# Patient Record
Sex: Female | Born: 1970 | Race: White | Hispanic: No | Smoking: Former smoker
Health system: Southern US, Community
[De-identification: ages and names within clinical notes are randomized; demographics above are authoritative.]

## PROBLEM LIST (undated history)

## (undated) DIAGNOSIS — R51 Headache: Secondary | ICD-10-CM

## (undated) DIAGNOSIS — E079 Disorder of thyroid, unspecified: Secondary | ICD-10-CM

## (undated) DIAGNOSIS — J309 Allergic rhinitis, unspecified: Secondary | ICD-10-CM

## (undated) DIAGNOSIS — F32A Depression, unspecified: Secondary | ICD-10-CM

## (undated) DIAGNOSIS — R7309 Other abnormal glucose: Secondary | ICD-10-CM

## (undated) DIAGNOSIS — IMO0001 Reserved for inherently not codable concepts without codable children: Secondary | ICD-10-CM

## (undated) DIAGNOSIS — M1990 Unspecified osteoarthritis, unspecified site: Secondary | ICD-10-CM

## (undated) DIAGNOSIS — K219 Gastro-esophageal reflux disease without esophagitis: Secondary | ICD-10-CM

## (undated) DIAGNOSIS — I10 Essential (primary) hypertension: Secondary | ICD-10-CM

## (undated) DIAGNOSIS — G4730 Sleep apnea, unspecified: Secondary | ICD-10-CM

## (undated) DIAGNOSIS — F329 Major depressive disorder, single episode, unspecified: Secondary | ICD-10-CM

## (undated) DIAGNOSIS — G894 Chronic pain syndrome: Secondary | ICD-10-CM

## (undated) DIAGNOSIS — T7840XA Allergy, unspecified, initial encounter: Secondary | ICD-10-CM

## (undated) DIAGNOSIS — F419 Anxiety disorder, unspecified: Secondary | ICD-10-CM

## (undated) HISTORY — DX: Headache: R51

## (undated) HISTORY — DX: Chronic pain syndrome: G89.4

## (undated) HISTORY — DX: Gastro-esophageal reflux disease without esophagitis: K21.9

## (undated) HISTORY — DX: Anxiety disorder, unspecified: F41.9

## (undated) HISTORY — DX: Allergy, unspecified, initial encounter: T78.40XA

## (undated) HISTORY — PX: UPPER GASTROINTESTINAL ENDOSCOPY: SHX188

## (undated) HISTORY — DX: Major depressive disorder, single episode, unspecified: F32.9

## (undated) HISTORY — DX: Depression, unspecified: F32.A

## (undated) HISTORY — DX: Allergic rhinitis, unspecified: J30.9

## (undated) HISTORY — DX: Essential (primary) hypertension: I10

## (undated) HISTORY — DX: Hemochromatosis, unspecified: E83.119

## (undated) HISTORY — DX: Reserved for inherently not codable concepts without codable children: IMO0001

## (undated) HISTORY — DX: Unspecified osteoarthritis, unspecified site: M19.90

## (undated) HISTORY — DX: Sleep apnea, unspecified: G47.30

## (undated) HISTORY — DX: Disorder of thyroid, unspecified: E07.9

## (undated) HISTORY — PX: TONSILECTOMY, ADENOIDECTOMY, BILATERAL MYRINGOTOMY AND TUBES: SHX2538

## (undated) HISTORY — DX: Other abnormal glucose: R73.09

---

## 1998-06-23 HISTORY — PX: ABDOMINAL HYSTERECTOMY: SHX81

## 2003-06-24 HISTORY — PX: CERVICAL FUSION: SHX112

## 2004-06-23 LAB — CONVERTED CEMR LAB: Pap Smear: NORMAL

## 2008-11-23 ENCOUNTER — Encounter: Payer: Self-pay | Admitting: Family Medicine

## 2009-10-23 ENCOUNTER — Ambulatory Visit: Payer: Self-pay | Admitting: Family Medicine

## 2009-10-23 DIAGNOSIS — R7309 Other abnormal glucose: Secondary | ICD-10-CM | POA: Insufficient documentation

## 2009-10-23 DIAGNOSIS — K219 Gastro-esophageal reflux disease without esophagitis: Secondary | ICD-10-CM

## 2009-10-23 DIAGNOSIS — R519 Headache, unspecified: Secondary | ICD-10-CM | POA: Insufficient documentation

## 2009-10-23 DIAGNOSIS — IMO0001 Reserved for inherently not codable concepts without codable children: Secondary | ICD-10-CM

## 2009-10-23 DIAGNOSIS — R32 Unspecified urinary incontinence: Secondary | ICD-10-CM | POA: Insufficient documentation

## 2009-10-23 DIAGNOSIS — M797 Fibromyalgia: Secondary | ICD-10-CM | POA: Insufficient documentation

## 2009-10-23 DIAGNOSIS — J309 Allergic rhinitis, unspecified: Secondary | ICD-10-CM

## 2009-10-23 DIAGNOSIS — R51 Headache: Secondary | ICD-10-CM | POA: Insufficient documentation

## 2009-10-23 DIAGNOSIS — I10 Essential (primary) hypertension: Secondary | ICD-10-CM | POA: Insufficient documentation

## 2009-10-23 HISTORY — DX: Allergic rhinitis, unspecified: J30.9

## 2009-10-23 HISTORY — DX: Reserved for inherently not codable concepts without codable children: IMO0001

## 2009-10-23 HISTORY — DX: Other abnormal glucose: R73.09

## 2009-10-23 HISTORY — DX: Headache: R51

## 2009-10-23 HISTORY — DX: Essential (primary) hypertension: I10

## 2009-10-23 HISTORY — DX: Gastro-esophageal reflux disease without esophagitis: K21.9

## 2009-10-24 DIAGNOSIS — F3289 Other specified depressive episodes: Secondary | ICD-10-CM

## 2009-10-24 DIAGNOSIS — F32A Depression, unspecified: Secondary | ICD-10-CM | POA: Insufficient documentation

## 2009-10-24 DIAGNOSIS — F329 Major depressive disorder, single episode, unspecified: Secondary | ICD-10-CM | POA: Insufficient documentation

## 2009-10-24 HISTORY — DX: Other specified depressive episodes: F32.89

## 2009-10-24 HISTORY — DX: Major depressive disorder, single episode, unspecified: F32.9

## 2009-10-25 ENCOUNTER — Ambulatory Visit: Payer: Self-pay | Admitting: Family Medicine

## 2009-10-26 LAB — CONVERTED CEMR LAB
AST: 23 units/L (ref 0–37)
Albumin: 4.5 g/dL (ref 3.5–5.2)
BUN: 12 mg/dL (ref 6–23)
Basophils Absolute: 0 10*3/uL (ref 0.0–0.1)
Basophils Relative: 0.5 % (ref 0.0–3.0)
Calcium: 9.7 mg/dL (ref 8.4–10.5)
Cholesterol: 180 mg/dL (ref 0–200)
Creatinine, Ser: 0.8 mg/dL (ref 0.4–1.2)
Eosinophils Absolute: 0.1 10*3/uL (ref 0.0–0.7)
GFR calc non Af Amer: 86.85 mL/min (ref >60)
HCT: 43.9 % (ref 36.0–46.0)
Hemoglobin, Urine: NEGATIVE
Hemoglobin: 15.3 g/dL — ABNORMAL HIGH (ref 12.0–15.0)
Ketones, ur: NEGATIVE mg/dL
Leukocytes, UA: NEGATIVE
Lymphs Abs: 1.8 10*3/uL (ref 0.7–4.0)
MCHC: 34.9 g/dL (ref 30.0–36.0)
Monocytes Relative: 6.5 % (ref 3.0–12.0)
Neutro Abs: 3.6 10*3/uL (ref 1.4–7.7)
RBC: 4.65 M/uL (ref 3.87–5.11)
RDW: 12.3 % (ref 11.5–14.6)
Specific Gravity, Urine: 1.02 (ref 1.000–1.030)
TSH: 1.59 microintl units/mL (ref 0.35–5.50)
Total Protein: 7.5 g/dL (ref 6.0–8.3)
Triglycerides: 165 mg/dL — ABNORMAL HIGH (ref 0.0–149.0)
Urine Glucose: NEGATIVE mg/dL
Urobilinogen, UA: 0.2 (ref 0.0–1.0)

## 2010-01-04 ENCOUNTER — Telehealth: Payer: Self-pay | Admitting: Family Medicine

## 2010-02-06 ENCOUNTER — Telehealth: Payer: Self-pay | Admitting: Family Medicine

## 2010-03-06 ENCOUNTER — Encounter: Payer: Self-pay | Admitting: Family Medicine

## 2010-03-22 ENCOUNTER — Encounter: Payer: Self-pay | Admitting: Family Medicine

## 2010-07-23 NOTE — Letter (Signed)
Summary: [facility]  [facility]   Imported By: Maryln Gottron 04/01/2010 11:25:14  _____________________________________________________________________  External Attachment:    Type:   Image     Comment:   External Document

## 2010-07-23 NOTE — Progress Notes (Signed)
Summary: referral  Phone Note Call from Patient   Caller: Patient Call For: Nelwyn Salisbury MD Summary of Call: Pt wants a referral to Dr. Corliss Skains ASAP, please.  States she has spoken to Dr. Clent Ridges about this . Initial call taken by: Lynann Beaver CMA,  January 04, 2010 2:16 PM  Follow-up for Phone Call        refer to Dr. Corliss Skains (rheumatology) for fibromyalgia  Follow-up by: Nelwyn Salisbury MD,  January 07, 2010 2:09 PM

## 2010-07-23 NOTE — Assessment & Plan Note (Signed)
Summary: TO BE EST/OK PER DOC/NJR   Vital Signs:  Patient profile:   40 year old female Height:      61 inches Weight:      159 pounds BMI:     30.15 Pulse rate:   100 / minute Pulse rhythm:   regular BP sitting:   122 / 84  (left arm) Cuff size:   regular  Vitals Entered By: Raechel Ache, RN (Oct 23, 2009 3:57 PM) CC: To Establish. C/o fibromyalgia, rash on neck . Needs few refills.   History of Present Illness: 40 yr old female to re-establish with [place] after transfering from Dr. Jacalyn Lefevre. She is doing reasonably well in general but needs some refills. She has been unemployed for quite awhile due to her chronic pain syndrome from fibromyalgia. She lives with her long time boyfriend, and he has been providing income for both of them. She continues to see Marlane Hatcher in [facility] for pain management. 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 She recently starting doing water aerobics classes, and this has been very helpful for her.   Preventive Screening-Counseling & Management  Alcohol-Tobacco     Smoking Status: current  Caffeine-Diet-Exercise     Does Patient Exercise: yes      Drug Use:  no.    Allergies (verified): 1)  ! Zithromax Z-Pak (Azithromycin)  Past History:  Past Medical History: Allergic rhinitis GERD Headache Hypertension Urinary incontinence UTI's Hx chicken pox Fibromyalgia, sees Ginger Organ PA at [facility] for pain mgmt. at [address], [address], [place], [place] [address] SVD times 2 Depression with anxiety hyperglycemia  Past Surgical History: Cervical fusion 2005, at C5-C6 Tonsillectomy partial Hysterectomy with ovaries intact 2000  Family History: Reviewed history and no changes required. Family History of CAD Female 1st degree relative <50 Family History Diabetes 1st degree  relative Family History Hypertension Family History Kidney disease  Social History: Reviewed history and no changes required. Occupation: unemployed Divorced Current Smoker Alcohol use-no Drug use-no Regular exercise-yes Smoking Status:  current Occupation:  employed Drug Use:  no Does Patient Exercise:  yes  Review of Systems  The patient denies anorexia, fever, weight loss, weight gain, vision loss, decreased hearing, hoarseness, chest pain, syncope, dyspnea on exertion, peripheral edema, prolonged cough, hemoptysis, abdominal pain, melena, hematochezia, severe indigestion/heartburn, hematuria, incontinence, genital sores, muscle weakness, suspicious skin lesions, transient blindness, difficulty walking, unusual weight change, abnormal bleeding, enlarged lymph nodes, angioedema, breast masses, and testicular masses.    Physical Exam  General:  Well-developed,well-nourished,in no acute distress; alert,appropriate and cooperative throughout examination Neck:  No deformities, masses, or tenderness noted. Lungs:  Normal respiratory effort, chest expands symmetrically. Lungs are clear to auscultation, no crackles or wheezes. Heart:  Normal rate and regular rhythm. S1 and S2 normal without gallop, murmur, click, rub or other extra sounds. Psych:  Oriented X3, memory intact for recent and remote, normally interactive, good eye contact, and depressed affect.     Impression & Recommendations:  Problem # 1:  HYPERGLYCEMIA (ICD-790.29)  Problem # 2:  DEPRESSION (ICD-311)  Her updated medication list for this problem includes:    Celexa 20 Mg Tabs (Citalopram hydrobromide) .Marland Kitchen... 1 once daily  Klonopin 0.5 Mg Tabs (Clonazepam) .Marland Kitchen... 1 two times a day  Problem # 3:  HYPERTENSION (ICD-401.9)  Her updated medication list for this problem includes:    Zestoretic 10-12.5 Mg Tabs (Lisinopril-hydrochlorothiazide) .Marland Kitchen... 1 once daily  Problem # 4:  HEADACHE (ICD-784.0)  Her updated  medication list for this problem includes:    Nucynta 75 Mg Tabs (Tapentadol hcl) .Marland Kitchen... 1 three times a day  Problem # 5:  GERD (ICD-530.81)  Her updated medication list for this problem includes:    Prilosec Otc 20 Mg Tbec (Omeprazole magnesium) .Marland Kitchen... 1 once daily  Problem # 6:  FIBROMYALGIA (ICD-729.1)  Her updated medication list for this problem includes:    Nucynta 75 Mg Tabs (Tapentadol hcl) .Marland Kitchen... 1 three times a day    Zanaflex 4 Mg Tabs (Tizanidine hcl) .Marland Kitchen... 1 three times a day  Complete Medication List: 1)  Zestoretic 10-12.5 Mg Tabs (Lisinopril-hydrochlorothiazide) .Marland Kitchen.. 1 once daily 2)  Celexa 20 Mg Tabs (Citalopram hydrobromide) .Marland Kitchen.. 1 once daily 3)  Nucynta 75 Mg Tabs (Tapentadol hcl) .Marland Kitchen.. 1 three times a day 4)  Klonopin 0.5 Mg Tabs (Clonazepam) .Marland Kitchen.. 1 two times a day 5)  Ambien 10 Mg Tabs (Zolpidem tartrate) .Marland Kitchen.. 1 at bedtime 6)  Zanaflex 4 Mg Tabs (Tizanidine hcl) .Marland Kitchen.. 1 three times a day 7)  Prilosec Otc 20 Mg Tbec (Omeprazole magnesium) .Marland Kitchen.. 1 once daily 8)  Sudafed Pe Maximum Strength 10 Mg Tabs (Phenylephrine hcl) .... Prn  Patient Instructions: 1)  We refilled some meds. Will get old records from Dr. Hyacinth Meeker. Plan to do a cpx with labs soon.  Prescriptions: CELEXA 20 MG TABS (CITALOPRAM HYDROBROMIDE) 1 once daily  #90 x 3   Entered and Authorized by:   Nelwyn Salisbury MD   Signed by:   Nelwyn Salisbury MD on 10/23/2009   Method used:   Electronically to        [facility]  [phone]* (retail)       [address]       [place], [place]  [address]       Ph: [phone] or [phone]       Fax: [phone]   RxID:   [id number] ZESTORETIC 10-12.5 MG TABS (LISINOPRIL-HYDROCHLOROTHIAZIDE) 1 once daily  #90 x 3   Entered and Authorized by:   Nelwyn Salisbury MD   Signed by:   Nelwyn Salisbury MD on 10/23/2009   Method used:   Electronically to        [facility]  [phone]* (retail)       [address]        [place], [place]  [address]       Ph: [phone] or [phone]       Fax: [phone]   RxID:   [id number]    Immunization History:  Tetanus/Td Immunization History:    Tetanus/Td:  td (11/09/2008)    Preventive Care Screening  Last Tetanus Booster:    Date:  11/09/2008    Results:  Td   Pap Smear:    Date:  06/23/2004    Results:  normal    Appended Document: TO BE EST/OK PER DOC/NJR called

## 2010-07-23 NOTE — Consult Note (Signed)
Summary: Rheumatology-Dr. Stacey Drain  Rheumatology-Dr. Stacey Drain   Imported By: Maryln Gottron 03/26/2010 11:14:30  _____________________________________________________________________  External Attachment:    Type:   Image     Comment:   External Document

## 2010-07-23 NOTE — Progress Notes (Signed)
Summary: [id number]  Phone Note Call from Patient   Caller: Patient Summary of Call: Dr Corliss Skains refused to see her- wants to know where to go from here?; want diagnosis and help. Initial call taken by: VM  Follow-up for Phone Call        try refering to the [facility] for diffuse joint pains Follow-up by: Nelwyn Salisbury MD,  February 06, 2010 5:00 PM  Additional Follow-up for Phone Call Additional follow up Details #1::        [id number] Additional Follow-up by: Raechel Ache, RN,  February 06, 2010 5:03 PM     Appended Document: Orders Update already going to pain clinic ; Dr Clent Ridges says try another rheumatologist.   Clinical Lists Changes  Orders: Added new Referral order of Rheumatology Referral (Rheumatology) - Signed

## 2010-07-23 NOTE — Letter (Signed)
Summary: Records from [facility] 2006 - 2010  Records from [facility] 2006 - 2010   Imported By: Maryln Gottron 04/11/2010 11:01:55  _____________________________________________________________________  External Attachment:    Type:   Image     Comment:   External Document

## 2010-08-16 ENCOUNTER — Encounter: Payer: Self-pay | Admitting: Family Medicine

## 2010-08-19 ENCOUNTER — Ambulatory Visit (INDEPENDENT_AMBULATORY_CARE_PROVIDER_SITE_OTHER): Payer: PRIVATE HEALTH INSURANCE | Admitting: Family Medicine

## 2010-08-19 ENCOUNTER — Encounter: Payer: Self-pay | Admitting: Family Medicine

## 2010-08-19 VITALS — BP 132/90 | HR 99 | Temp 98.9°F | Wt 150.0 lb

## 2010-08-19 DIAGNOSIS — M542 Cervicalgia: Secondary | ICD-10-CM

## 2010-08-19 DIAGNOSIS — J029 Acute pharyngitis, unspecified: Secondary | ICD-10-CM

## 2010-08-19 DIAGNOSIS — N390 Urinary tract infection, site not specified: Secondary | ICD-10-CM

## 2010-08-19 DIAGNOSIS — M2550 Pain in unspecified joint: Secondary | ICD-10-CM

## 2010-08-19 DIAGNOSIS — I10 Essential (primary) hypertension: Secondary | ICD-10-CM

## 2010-08-19 LAB — POCT URINALYSIS DIPSTICK
Bilirubin, UA: NEGATIVE
Glucose, UA: NEGATIVE
Ketones, UA: NEGATIVE
Spec Grav, UA: 1.005
Urobilinogen, UA: 0.2

## 2010-08-19 MED ORDER — FLUTICASONE PROPIONATE 50 MCG/ACT NA SUSP: 1.0000 | Freq: Every day | NASAL | Status: DC

## 2010-08-19 NOTE — Progress Notes (Signed)
  Subjective:    Patient ID: Margaret [name], female    DOB: 1971/01/21, 40 y.o.   MRN: [id number]  HPI Here for one month of stuffy head, PND,and a ST. No fever or cough. Using Zyrtec. She wants to see another rheumatologist since she was not happy about her visit with Dr. Kellie Simmering. She also has had some increased lower neck and upper baclk pain ove rth epast month. She had surgery by a surgeon in [place] in 2005 but she wants to see someone here. The pain radiates down the right arm and her hand gets numb.    Review of Systems  Constitutional: Negative.   HENT: Positive for sore throat and postnasal drip.   Musculoskeletal: Positive for back pain.       Objective:   Physical Exam  Constitutional: She appears well-developed and well-nourished.  HENT:  Head: Normocephalic and atraumatic.  Right Ear: External ear normal.  Left Ear: External ear normal.  Nose: Nose normal.  Mouth/Throat: Oropharynx is clear and moist. No oropharyngeal exudate.  Neck: Normal range of motion. Neck supple.  Musculoskeletal: She exhibits no tenderness.  Lymphadenopathy:    She has no cervical adenopathy.          Assessment & Plan:  The ST is from PND. Add a nasal steroid. Refer for another rheumatology opinion and to spinal surgery

## 2010-08-27 ENCOUNTER — Ambulatory Visit (INDEPENDENT_AMBULATORY_CARE_PROVIDER_SITE_OTHER): Payer: PRIVATE HEALTH INSURANCE | Admitting: Family Medicine

## 2010-08-27 ENCOUNTER — Encounter: Payer: Self-pay | Admitting: Family Medicine

## 2010-08-27 VITALS — BP 110/70 | Temp 98.7°F | Ht 61.0 in | Wt 146.5 lb

## 2010-08-27 DIAGNOSIS — IMO0001 Reserved for inherently not codable concepts without codable children: Secondary | ICD-10-CM

## 2010-08-27 DIAGNOSIS — R300 Dysuria: Secondary | ICD-10-CM

## 2010-08-27 DIAGNOSIS — M791 Myalgia, unspecified site: Secondary | ICD-10-CM

## 2010-08-27 LAB — CBC WITH DIFFERENTIAL/PLATELET
Basophils Absolute: 0 10*3/uL (ref 0.0–0.1)
Eosinophils Absolute: 0.1 10*3/uL (ref 0.0–0.7)
Lymphocytes Relative: 30.9 % (ref 12.0–46.0)
MCHC: 35 g/dL (ref 30.0–36.0)
Neutrophils Relative %: 60.8 % (ref 43.0–77.0)
RBC: 4.6 Mil/uL (ref 3.87–5.11)
RDW: 12.6 % (ref 11.5–14.6)

## 2010-08-27 LAB — BASIC METABOLIC PANEL
CO2: 32 mEq/L (ref 19–32)
Calcium: 10.2 mg/dL (ref 8.4–10.5)
GFR: 81.64 mL/min (ref >60.00)
Potassium: 5.7 mEq/L — ABNORMAL HIGH (ref 3.5–5.1)
Sodium: 143 mEq/L (ref 135–145)

## 2010-08-27 LAB — POCT URINALYSIS DIPSTICK
Bilirubin, UA: NEGATIVE
Glucose, UA: NEGATIVE
Ketones, UA: NEGATIVE
Nitrite, UA: NEGATIVE
Protein, UA: NEGATIVE

## 2010-08-27 LAB — HEPATIC FUNCTION PANEL
Alkaline Phosphatase: 68 U/L (ref 39–117)
Bilirubin, Direct: 0.1 mg/dL (ref 0.0–0.3)
Total Protein: 7.2 g/dL (ref 6.0–8.3)

## 2010-08-27 NOTE — Progress Notes (Signed)
  Subjective:    Patient ID: Margaret [name], female    DOB: 1970-06-29, 40 y.o.   MRN: [id number]  HPI Here for one week of mild bladder pain, urgency to urinate, and incontinence. No fever or nausea. Azo helps a little. Also she goes into great detail telling me about the widespread chronic sympotms she has such as hot and cold intolerance, hair falling out, diffuse body aches, and chronic fatigue. She has been diagnosed with fibromyalgia, but she is very frustrated that no one seems to be able to diagnose her and help her feel better.    Review of Systems  Constitutional: Positive for chills, diaphoresis, fatigue and unexpected weight change.  HENT: Negative.   Respiratory: Negative.   Cardiovascular: Negative.   Gastrointestinal: Positive for abdominal pain. Negative for nausea and vomiting.  Genitourinary: Positive for dysuria, urgency, frequency, difficulty urinating and pelvic pain.  Musculoskeletal: Positive for myalgias and arthralgias. Negative for joint swelling.       Objective:   Physical Exam  Constitutional: She is oriented to person, place, and time. She appears well-developed and well-nourished.  Neck: No thyromegaly present.  Cardiovascular: Normal rate, regular rhythm, normal heart sounds and intact distal pulses.   Pulmonary/Chest: Effort normal and breath sounds normal.  Abdominal: Soft. Bowel sounds are normal. She exhibits no distension and no mass. There is no tenderness. There is no rebound and no guarding.  Musculoskeletal: Normal range of motion. She exhibits no edema and no tenderness.  Lymphadenopathy:    She has no cervical adenopathy.  Neurological: She is alert and oriented to person, place, and time.  Psychiatric: She has a normal mood and affect. Her behavior is normal. Judgment and thought content normal.          Assessment & Plan:  She has a myriad of symptoms that are difficult to put together. Interstitial cystitis is a possibility, so we will  refer her to Urology. Fibromyalgia is also possible.  Get routine labs

## 2010-08-28 LAB — VITAMIN D 25 HYDROXY (VIT D DEFICIENCY, FRACTURES): Vit D, 25-Hydroxy: 42 ng/mL (ref 30–89)

## 2010-08-28 LAB — B. BURGDORFI ANTIBODIES: B burgdorferi Ab IgG+IgM: 0.72 {ISR}

## 2010-09-02 ENCOUNTER — Telehealth: Payer: Self-pay

## 2010-09-02 NOTE — Telephone Encounter (Signed)
Message copied by Madison Hickman on Mon Sep 02, 2010 11:43 AM ------      Message from: Dwaine Deter      Created: Fri Aug 30, 2010  5:40 PM       I think she should see GI in that case, probably needs an endoscopy

## 2010-09-02 NOTE — Telephone Encounter (Signed)
States she stopped her vitamin supp and no more heartburn. States her "hair is coming out and also thinks her Blood Sugars "bottom out"  pls advise.

## 2010-09-04 NOTE — Telephone Encounter (Signed)
I don't know what else to do for her. Refer her to Dr. Talmage Nap for an Endocrine workup for hair loss, weakness, etc.

## 2010-10-25 ENCOUNTER — Telehealth: Payer: Self-pay | Admitting: *Deleted

## 2010-10-25 DIAGNOSIS — F3289 Other specified depressive episodes: Secondary | ICD-10-CM

## 2010-10-25 DIAGNOSIS — R32 Unspecified urinary incontinence: Secondary | ICD-10-CM

## 2010-10-25 DIAGNOSIS — IMO0001 Reserved for inherently not codable concepts without codable children: Secondary | ICD-10-CM

## 2010-10-25 DIAGNOSIS — R7309 Other abnormal glucose: Secondary | ICD-10-CM

## 2010-10-25 DIAGNOSIS — F329 Major depressive disorder, single episode, unspecified: Secondary | ICD-10-CM

## 2010-10-25 NOTE — Telephone Encounter (Signed)
Would like a new referral to endo.

## 2010-11-04 ENCOUNTER — Ambulatory Visit: Payer: PRIVATE HEALTH INSURANCE | Admitting: Endocrinology

## 2010-11-12 ENCOUNTER — Encounter: Payer: Self-pay | Admitting: Endocrinology

## 2010-11-12 ENCOUNTER — Other Ambulatory Visit (INDEPENDENT_AMBULATORY_CARE_PROVIDER_SITE_OTHER): Payer: PRIVATE HEALTH INSURANCE

## 2010-11-12 ENCOUNTER — Ambulatory Visit (INDEPENDENT_AMBULATORY_CARE_PROVIDER_SITE_OTHER): Payer: PRIVATE HEALTH INSURANCE | Admitting: Endocrinology

## 2010-11-12 VITALS — BP 108/72 | HR 70 | Temp 98.4°F | Ht 61.0 in | Wt 143.2 lb

## 2010-11-12 DIAGNOSIS — F411 Generalized anxiety disorder: Secondary | ICD-10-CM | POA: Insufficient documentation

## 2010-11-12 LAB — T3, FREE: T3, Free: 3 pg/mL (ref 2.3–4.2)

## 2010-11-12 LAB — T4, FREE: Free T4: 0.63 ng/dL (ref 0.60–1.60)

## 2010-11-12 NOTE — Patient Instructions (Addendum)
Here is a blood sugar-testing device, and some strips.   The significance of the hypoglycemia, if that is what you have lies in the fact that this is a risk-factor for diabetes.   Please call if you want to do a 24-hour urine for "adrenaline." blood tests are being ordered for you today.  please call [phone] to hear your test results.  You will be prompted to enter the 9-digit "MRN" number that appears at the top left of this page, followed by #.  Then you will hear the message. (update: i left message on phone-tree:  rx as we discussed)

## 2010-11-12 NOTE — Progress Notes (Signed)
Subjective:    Patient ID: Margaret [name], female    DOB: 11-Dec-1970, 40 y.o.   MRN: [id number]  HPI Pt states 3 mos of moderate hair loss, throughout the head, and assoc cold intolerance.  She also has intermittent sxs of tremor in the mid-morning, or mid-afternoon, but not in the middle of the night.  She has not checked cbg then, but suspects it is low.  sxs are relieved by drinking sugar-containing drink.  She says "my fight or flight mechanism is always on."   Past Medical History  Diagnosis Date  . Fibromyalgia   . Depression   . Hyperglycemia   . Urinary incontinence   . Hypertension   . GERD (gastroesophageal reflux disease)   . Allergy   . ALLERGIC RHINITIS 10/23/2009  . DEPRESSION 10/24/2009  . FIBROMYALGIA 10/23/2009  . GERD 10/23/2009  . Headache 10/23/2009  . HYPERGLYCEMIA 10/23/2009  . HYPERTENSION 10/23/2009    Past Surgical History  Procedure Date  . Cervical fusion 2005    at C5 and C6  . Tonsilectomy, adenoidectomy, bilateral myringotomy and tubes   . Abdominal hysterectomy 2000    with ovaries intact    History   Social History  . Marital Status: Single    Spouse Name: N/A    Number of Children: N/A  . Years of Education: N/A   Occupational History  . Not on file.   Social History Main Topics  . Smoking status: Current Everyday Smoker -- 0.5 packs/day  . Smokeless tobacco: Not on file  . Alcohol Use: No  . Drug Use: No  . Sexually Active: Not on file   Other Topics Concern  . Not on file   Social History Narrative  . No narrative on file  single, but it ltr.  2 children  Current Outpatient Prescriptions on File Prior to Visit  Medication Sig Dispense Refill  . citalopram (CELEXA) 20 MG tablet Take 20 mg by mouth daily.        . clonazePAM (KLONOPIN) 0.5 MG tablet Take 0.5 mg by mouth 2 (two) times daily as needed.        . fluticasone (FLONASE) 50 MCG/ACT nasal spray 1 spray by Nasal route daily.  16 g  11  . HYDROcodone-acetaminophen (NORCO) 10-325  MG per tablet Take 1 tablet by mouth 4 (four) times daily as needed.        Marland Kitchen lisinopril-hydrochlorothiazide (PRINZIDE,ZESTORETIC) 10-12.5 MG per tablet Take 1 tablet by mouth daily.        . methadone (DOLOPHINE) 5 MG tablet Take 5 mg by mouth 2 (two) times daily.        Marland Kitchen omeprazole (PRILOSEC) 20 MG capsule Take 20 mg by mouth daily.        Marland Kitchen tiZANidine (ZANAFLEX) 4 MG tablet Take 4 mg by mouth 3 (three) times daily.        Marland Kitchen zolpidem (AMBIEN) 10 MG tablet Take 10 mg by mouth at bedtime as needed.        . Cranberry 1000 MG CAPS Take by mouth.        . Tapentadol HCl (NUCYNTA) 75 MG TABS Take 1 tablet by mouth 3 (three) times daily.          Allergies  Allergen Reactions  . Azithromycin     Family History  Problem Relation Age of Onset  . Coronary artery disease      relative <50  . Hypertension Mother   . Heart disease Mother   .  Diabetes Mother   . Diabetes Father   . Kidney disease Father     kidney disease  . Hypertension Brother   . Hypertension Paternal Aunt     BP 108/72  Pulse 70  Temp(Src) 98.4 F (36.9 C) (Oral)  Ht 5\' 1"  (1.549 m)  Wt 143 lb 3.2 oz (64.955 kg)  BMI 27.06 kg/m2  SpO2 95%    Review of Systems She has fatigue, muscle cramps, difficulty with concentration, anxiety, numbness of the fingers, myalgias, dry skin, and nasal congestion. denies depression, rash, sob, weight gait, headache, palpitations, blurry vision, easy bruising, and syncope.  She can't tell about menses, due to hysterect.  she attributes constip to meds.    Objective:   Physical Exam VS: see vs page GEN: no distress HEAD: head: no deformity eyes: no periorbital swelling, no proptosis external nose and ears are normal mouth: no lesion seen NECK: supple, thyroid is not enlarged CHEST WALL: no deformity CV: reg rate and rhythm, no murmur ABD: abdomen is soft, nontender.  no hepatosplenomegaly.  not distended.  no hernia MUSCULOSKELETAL: muscle bulk and strength are grossly  normal.  no obvious joint swelling.  gait is normal and steady EXTEMITIES: no deformity.  no ulcer on the feet.  feet are of normal color and temp.  no edema PULSES: dorsalis pedis intact bilat.   NEURO:  cn 2-12 grossly intact.   readily moves all 4's.  sensation is intact to touch on the feet SKIN:  Normal texture and temperature.  No rash or suspicious lesion is visible.   NODES:  None palpable at the neck PSYCH: alert, oriented x3.  Does not appear anxious nor depressed.     Lab Results  Component Value Date   HGBA1C 5.5 10/25/2009   Lab Results  Component Value Date   TSH 2.14 08/27/2010     Assessment & Plan:  sxs c/w reactive hypoglycemia, which is a risk-factor for dm. Anxiety, uncertain etiology Fatigue, not thyroid-related

## 2010-11-27 ENCOUNTER — Other Ambulatory Visit: Payer: Self-pay | Admitting: Obstetrics and Gynecology

## 2010-11-27 ENCOUNTER — Encounter (INDEPENDENT_AMBULATORY_CARE_PROVIDER_SITE_OTHER): Payer: PRIVATE HEALTH INSURANCE | Admitting: Obstetrics and Gynecology

## 2010-11-27 DIAGNOSIS — Z01419 Encounter for gynecological examination (general) (routine) without abnormal findings: Secondary | ICD-10-CM

## 2010-11-27 DIAGNOSIS — Z1231 Encounter for screening mammogram for malignant neoplasm of breast: Secondary | ICD-10-CM

## 2010-11-27 DIAGNOSIS — Z124 Encounter for screening for malignant neoplasm of cervix: Secondary | ICD-10-CM

## 2010-11-28 NOTE — Group Therapy Note (Signed)
NAMEAMILLIA, [name] NO.:  [id number]  MEDICAL RECORD NO.:  [id number]           PATIENT TYPE:  A  LOCATION:  [facility]                   FACILITY:  [facility]  PHYSICIAN:  Argentina Donovan, MD        DATE OF BIRTH:  12-03-1970  DATE OF SERVICE:  11/27/2010                                 CLINIC NOTE  HISTORY OF PRESENT ILLNESS:  The patient is a 40 year old gravida 2, para 75-49-29-25, 40 year old and 61 year old child who had total abdominal hysterectomy because of dysmenorrhea and bleeding.  Her last Pap smear was in 2007.  She has no complaints except related to GYN.  She has fibromyalgia and she is on many medications including methadone, Norco, clonazepam, Flexeril, and Ambien.  She is on lisinopril for blood pressure.  PHYSICAL EXAMINATION:  VITAL SIGNS:  Today, her blood pressure is 114/81, she weighs 140 pounds, she is 5 feet and 1 inch tall. NECK:  Thyroid symmetrical, no masses. BREASTS:  Symmetrical.  No dominant masses.  No nipple discharge.  No supraclavicular or axillary nodes. ABDOMEN:  Soft, flat, and nontender.  No masses or organomegaly. GENITALIA:  External is normal.  BUS within normal limits.  Vagina is clean and well rugated status hysterectomy.  The adnexa is normal.  IMPRESSION:  Normal gynecological examination.          ______________________________ Argentina Donovan, MD    PR/MEDQ  D:  11/27/2010  T:  11/28/2010  Job:  [id number]

## 2010-12-01 ENCOUNTER — Other Ambulatory Visit: Payer: Self-pay | Admitting: Family Medicine

## 2010-12-09 ENCOUNTER — Ambulatory Visit (HOSPITAL_COMMUNITY)
Admission: RE | Admit: 2010-12-09 | Discharge: 2010-12-09 | Disposition: A | Discharge status: Home / Self Care | Payer: PRIVATE HEALTH INSURANCE | Source: Ambulatory Visit | Attending: Obstetrics and Gynecology | Admitting: Obstetrics and Gynecology

## 2010-12-09 DIAGNOSIS — Z1231 Encounter for screening mammogram for malignant neoplasm of breast: Secondary | ICD-10-CM | POA: Insufficient documentation

## 2010-12-11 ENCOUNTER — Other Ambulatory Visit: Payer: Self-pay | Admitting: Obstetrics and Gynecology

## 2010-12-11 DIAGNOSIS — R928 Other abnormal and inconclusive findings on diagnostic imaging of breast: Secondary | ICD-10-CM

## 2010-12-17 ENCOUNTER — Other Ambulatory Visit: Payer: Self-pay | Admitting: Obstetrics and Gynecology

## 2010-12-17 ENCOUNTER — Ambulatory Visit
Admission: RE | Admit: 2010-12-17 | Discharge: 2010-12-17 | Disposition: A | Discharge status: Home / Self Care | Payer: PRIVATE HEALTH INSURANCE | Source: Ambulatory Visit | Attending: Obstetrics and Gynecology | Admitting: Obstetrics and Gynecology

## 2010-12-17 DIAGNOSIS — R928 Other abnormal and inconclusive findings on diagnostic imaging of breast: Secondary | ICD-10-CM

## 2011-01-07 ENCOUNTER — Other Ambulatory Visit: Payer: PRIVATE HEALTH INSURANCE

## 2011-02-28 ENCOUNTER — Other Ambulatory Visit: Payer: Self-pay | Admitting: Family Medicine

## 2011-03-04 ENCOUNTER — Other Ambulatory Visit: Payer: Self-pay

## 2011-03-04 NOTE — Telephone Encounter (Signed)
Rx request for klonopin 0.5 mg; pt last seen on 11/12/10. Pls advise.

## 2011-03-06 MED ORDER — CLONAZEPAM 0.5 MG PO TABS: 0.5000 mg | ORAL_TABLET | Freq: Two times a day (BID) | ORAL | Status: DC | PRN

## 2011-03-06 NOTE — Telephone Encounter (Signed)
Call in a 6 month supply  

## 2011-05-30 ENCOUNTER — Other Ambulatory Visit: Payer: Self-pay | Admitting: Family Medicine

## 2011-05-30 NOTE — Telephone Encounter (Signed)
Scripts sent e-scribe 

## 2011-08-20 ENCOUNTER — Other Ambulatory Visit: Payer: Self-pay | Admitting: Family Medicine

## 2011-08-21 NOTE — Telephone Encounter (Signed)
Can we refill these? 

## 2011-08-22 NOTE — Telephone Encounter (Signed)
Okay for one month of each. She needs an OV soon

## 2011-09-15 ENCOUNTER — Ambulatory Visit (INDEPENDENT_AMBULATORY_CARE_PROVIDER_SITE_OTHER): Payer: PRIVATE HEALTH INSURANCE | Admitting: Family Medicine

## 2011-09-15 ENCOUNTER — Encounter: Payer: Self-pay | Admitting: Family Medicine

## 2011-09-15 VITALS — BP 112/70 | HR 80 | Temp 97.9°F | Wt 149.0 lb

## 2011-09-15 DIAGNOSIS — F32A Depression, unspecified: Secondary | ICD-10-CM

## 2011-09-15 DIAGNOSIS — F329 Major depressive disorder, single episode, unspecified: Secondary | ICD-10-CM

## 2011-09-15 DIAGNOSIS — M797 Fibromyalgia: Secondary | ICD-10-CM

## 2011-09-15 DIAGNOSIS — I10 Essential (primary) hypertension: Secondary | ICD-10-CM

## 2011-09-15 DIAGNOSIS — IMO0001 Reserved for inherently not codable concepts without codable children: Secondary | ICD-10-CM

## 2011-09-15 DIAGNOSIS — F3289 Other specified depressive episodes: Secondary | ICD-10-CM

## 2011-09-15 MED ORDER — CITALOPRAM HYDROBROMIDE 20 MG PO TABS: 20.0000 mg | ORAL_TABLET | Freq: Every day | ORAL | Status: DC

## 2011-09-15 MED ORDER — OMEPRAZOLE 40 MG PO CPDR: 40.0000 mg | DELAYED_RELEASE_CAPSULE | Freq: Every day | ORAL | Status: DC

## 2011-09-15 MED ORDER — LISINOPRIL-HYDROCHLOROTHIAZIDE 10-12.5 MG PO TABS: 1.0000 | ORAL_TABLET | Freq: Every day | ORAL | Status: DC

## 2011-09-15 MED ORDER — MOMETASONE FUROATE 50 MCG/ACT NA SUSP: 2.0000 | Freq: Every day | NASAL | Status: DC

## 2011-09-15 NOTE — Progress Notes (Signed)
  Subjective:    Patient ID: Margaret [name], female    DOB: 12-Jun-1971, 41 y.o.   MRN: [id number]  HPI Here to refill meds. She has been doing fairly well, although the recent cold damp weather has been hard on her. Trying to exercise.    Review of Systems  Constitutional: Negative.   Respiratory: Negative.   Cardiovascular: Negative.   Musculoskeletal: Positive for myalgias.       Objective:   Physical Exam  Constitutional: She appears well-developed and well-nourished.  Cardiovascular: Normal rate, regular rhythm, normal heart sounds and intact distal pulses.   Pulmonary/Chest: Effort normal and breath sounds normal.  Psychiatric: She has a normal mood and affect. Her behavior is normal. Thought content normal.          Assessment & Plan:  Stable, refilled meds

## 2012-03-26 ENCOUNTER — Ambulatory Visit: Payer: PRIVATE HEALTH INSURANCE

## 2012-03-26 ENCOUNTER — Ambulatory Visit: Payer: Self-pay | Admitting: Family Medicine

## 2012-03-26 VITALS — BP 128/80 | HR 81 | Temp 98.1°F | Resp 16 | Ht 61.0 in | Wt 145.0 lb

## 2012-03-26 DIAGNOSIS — M25531 Pain in right wrist: Secondary | ICD-10-CM

## 2012-03-26 DIAGNOSIS — M25539 Pain in unspecified wrist: Secondary | ICD-10-CM

## 2012-03-26 NOTE — Progress Notes (Signed)
  Subjective:    Patient ID: Margaret [name], female    DOB: 01-17-1971, 41 y.o.   MRN: [id number]  HPI  Margaret [name] is a 41 yr old female with an 8 hour history of right wrist pain.  She was packing her car today getting ready to leave the beach, and as she pushed a bag into her car, she hit her wrist on something.  She heard a pop and felt pain immediately.  She feels a deep throbbing pain that she rates 8-9/10.  She has used ibuprofen as well as a topical compounded pain medicine that she uses for her fibromyalgia.  She has used ice and an ace wrap with little relief.      Review of Systems  All other systems reviewed and are negative.       Objective:   Physical Exam  Constitutional: She appears well-developed and well-nourished. No distress.  Cardiovascular:  Pulses:      Radial pulses are 2+ on the right side, and 2+ on the left side.  Musculoskeletal:       Right wrist: She exhibits decreased range of motion, tenderness, bony tenderness and swelling.       Left wrist: Normal.       The right wrist is swollen compared to the left.  There is decreased ROM due to pain.  There is pain at the distal ulna and ulnar styloid.  Skin: Skin is warm and dry.     [facility] reading (PRIMARY) by  Dr. Chilton Si - no fracture noted.       Assessment & Plan:  Margaret [name] is a 41 yr old female here with right wrist injury.  Wrist films read as negative for fracture.  Likely a contusion.  Encouraged her to ice the area 10-20 minutes at a time, 2-3 times per day.  She may wrap the wrist for comfort and protection.  She is already on a complicated pain regimen for fibromyalgia and did not want to add anything more for pain.  She will use ibuprofen prn.  If her pain is worsening or not improving she will RTC.

## 2012-03-26 NOTE — Progress Notes (Signed)
Xray read and patient discussed with Ms Debbra Riding. No apparent fx.  Agree with assessment and plan of care per her note.

## 2012-03-26 NOTE — Progress Notes (Signed)
Addendum from outside call at 8:00 pm. Per radiologist  - possible nondisplaced scaphoid fx at waist, with clinical correlation recommended.  Ulnar sided pain, so unlikely fracture, but will have staff call pt to follow up if any radial sided wrist pain.

## 2012-03-31 ENCOUNTER — Ambulatory Visit (INDEPENDENT_AMBULATORY_CARE_PROVIDER_SITE_OTHER): Payer: PRIVATE HEALTH INSURANCE | Admitting: Family Medicine

## 2012-03-31 VITALS — BP 108/68 | HR 105 | Temp 98.6°F | Resp 16 | Ht 61.25 in | Wt 145.2 lb

## 2012-03-31 DIAGNOSIS — S63509A Unspecified sprain of unspecified wrist, initial encounter: Secondary | ICD-10-CM

## 2012-03-31 DIAGNOSIS — M79609 Pain in unspecified limb: Secondary | ICD-10-CM

## 2012-03-31 NOTE — Patient Instructions (Signed)
Wrist Pain  Wrist injuries are frequent in adults and children. A sprain is an injury to the ligaments that hold your bones together. A strain is an injury to muscle or muscle cord-like structures (tendons) from stretching or pulling. Generally, when wrists are moderately tender to touch following a fall or injury, a break in the bone (fracture) may be present. Most wrist sprains or strains are better in 3 to 5 days, but complete healing may take several weeks.  HOME CARE INSTRUCTIONS    Put ice on the injured area.   Put ice in a plastic bag.   Place a towel between your skin and the bag.   Leave the ice on for 15 to 20 minutes, 3 to 4 times a day, for the first 2 days.   Keep your arm raised above the level of your heart whenever possible to reduce swelling and pain.   Rest the injured area for at least 48 hours or as directed by your caregiver.   If a splint or elastic bandage has been applied, use it for as long as directed by your caregiver or until seen by a caregiver for a follow-up exam.   Only take over-the-counter or prescription medicines for pain, discomfort, or fever as directed by your caregiver.   Keep all follow-up appointments. You may need to follow up with a specialist or have follow-up X-rays. Improvement in pain level is not a guarantee that you did not fracture a bone in your wrist. The only way to determine whether or not you have a broken bone is by X-ray.  SEEK IMMEDIATE MEDICAL CARE IF:    Your fingers are swollen, very red, white, or cold and blue.   Your fingers are numb or tingling.   You have increasing pain.   You have difficulty moving your fingers.  MAKE SURE YOU:    Understand these instructions.   Will watch your condition.   Will get help right away if you are not doing well or get worse.  Document Released: 03/19/2005 Document Revised: 09/01/2011 Document Reviewed: 07/31/2010  [employer] Patient Information 2013 [employer].

## 2012-03-31 NOTE — Progress Notes (Signed)
  HPI Ms. Osowski is a 41 yr old female with a 5 day history of right wrist pain. She was packing her car today getting ready to leave the beach, and as she pushed a bag into her car, she hit her wrist on something. She heard a pop and felt pain immediately. She feels a deep throbbing pain that she rates 8-9/10. She has used ibuprofen as well as a topical compounded pain medicine that she uses for her fibromyalgia. She has used ice and an ace wrap with little relief.   Since Friday, she is feeling about the same (8-9/10 pain).  She is able to move the wrist and hand without much problem.  She says the ulnar side of the wrist is more uncomfortable.  Thumb and distal radial side of wrist are not painful.  Driving to [employer] later today or tomorrow.  Objective:  NAD, does not appear to be in pain Right wrist and hand exam:  Inspection:  Normal  ROM: normal except for supination and pronation  Palpation: mildly tender ulnar styloid and distal ulna; absolutely no pain in scaphoid area.  Pain is reproduced by pulling and pushing Clinical Data: Injury, pain.  RIGHT WRIST - COMPLETE 3+ VIEW  Comparison: None.  Findings: On the navicular view, there is suggestion of a linear  lucency through the mid pole of the scaphoid concerning for  nondisplaced scaphoid fracture. This is not visualized on the  other images. Recommend correlation for pain in this area.  IMPRESSION:  Suspicion for nondisplaced scaphoid mid pole fracture. Recommend  correlation for pain in this area.  Original Report Authenticated By: Cyndie Chime, M.D.  Assessment:  Strained wrist  Plan:  Continue current meds

## 2012-08-31 ENCOUNTER — Ambulatory Visit (INDEPENDENT_AMBULATORY_CARE_PROVIDER_SITE_OTHER): Payer: Self-pay | Admitting: Family Medicine

## 2012-08-31 ENCOUNTER — Encounter: Payer: Self-pay | Admitting: Family Medicine

## 2012-08-31 VITALS — BP 120/76 | HR 90 | Temp 98.8°F | Wt 148.0 lb

## 2012-08-31 DIAGNOSIS — M2550 Pain in unspecified joint: Secondary | ICD-10-CM

## 2012-08-31 DIAGNOSIS — K219 Gastro-esophageal reflux disease without esophagitis: Secondary | ICD-10-CM

## 2012-08-31 DIAGNOSIS — I10 Essential (primary) hypertension: Secondary | ICD-10-CM

## 2012-08-31 DIAGNOSIS — IMO0001 Reserved for inherently not codable concepts without codable children: Secondary | ICD-10-CM

## 2012-08-31 DIAGNOSIS — F329 Major depressive disorder, single episode, unspecified: Secondary | ICD-10-CM

## 2012-08-31 DIAGNOSIS — R739 Hyperglycemia, unspecified: Secondary | ICD-10-CM

## 2012-08-31 DIAGNOSIS — R7309 Other abnormal glucose: Secondary | ICD-10-CM

## 2012-08-31 LAB — BASIC METABOLIC PANEL
CO2: 27 mEq/L (ref 19–32)
Calcium: 9.3 mg/dL (ref 8.4–10.5)
Glucose, Bld: 92 mg/dL (ref 70–99)
Potassium: 4 mEq/L (ref 3.5–5.1)
Sodium: 131 mEq/L — ABNORMAL LOW (ref 135–145)

## 2012-08-31 MED ORDER — LISINOPRIL-HYDROCHLOROTHIAZIDE 10-12.5 MG PO TABS: 1.0000 | ORAL_TABLET | Freq: Every day | ORAL | Status: DC

## 2012-08-31 MED ORDER — PREDNISONE 10 MG PO TABS: 10.0000 mg | ORAL_TABLET | Freq: Every day | ORAL | Status: DC

## 2012-08-31 MED ORDER — CITALOPRAM HYDROBROMIDE 20 MG PO TABS: 20.0000 mg | ORAL_TABLET | Freq: Every day | ORAL | Status: DC

## 2012-08-31 NOTE — Progress Notes (Signed)
  Subjective:    Patient ID: Margaret [name], female    DOB: 05-25-1971, 42 y.o.   MRN: [id number]  HPI Here for follow up after a year. She has still been seeing her pain management doctor for fibromyalgia which is stable. However in the past 2 months she has developed diffuse joint pains, especially in the hands, hips, lower back, and knees. Her hands swell at times. She tried some Advil but could not tolerate this due to GI upset. She saw a rheumatologist at [facility] in 2012 for her fibromyalgia but she was not having joint pains at that time. She has been tested for rheumatoid arthritis in the past with negative serologies. Her BP is stable.    Review of Systems  Constitutional: Negative.   Respiratory: Negative.   Cardiovascular: Negative.   Musculoskeletal: Positive for myalgias, back pain, joint swelling and arthralgias.       Objective:   Physical Exam  Constitutional: She appears well-developed and well-nourished.  Neck: No thyromegaly present.  Cardiovascular: Normal rate, regular rhythm, normal heart sounds and intact distal pulses.   Pulmonary/Chest: Effort normal and breath sounds normal.  Musculoskeletal:  She is tender over the MCP joints and wrists bilaterally, no swelling is noted. Her fingers appear unaffected.   Lymphadenopathy:    She has no cervical adenopathy.          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 This is in addition to her fibromyalgia. Try low dose prednisone. Get a few labs today but we will not do a big workup since she does not have health insurance. Her HTN is stable.

## 2012-09-01 NOTE — Progress Notes (Signed)
Quick Note:  I left voice message with results. ______ 

## 2012-09-13 ENCOUNTER — Encounter: Payer: Self-pay | Admitting: Family Medicine

## 2012-09-17 ENCOUNTER — Encounter: Payer: Self-pay | Admitting: Family Medicine

## 2012-11-09 ENCOUNTER — Encounter: Payer: Self-pay | Admitting: Family Medicine

## 2012-11-09 ENCOUNTER — Ambulatory Visit (INDEPENDENT_AMBULATORY_CARE_PROVIDER_SITE_OTHER): Payer: BC Managed Care – PPO | Admitting: Family Medicine

## 2012-11-09 VITALS — BP 128/76 | HR 76 | Temp 98.4°F | Wt 149.0 lb

## 2012-11-09 DIAGNOSIS — M129 Arthropathy, unspecified: Secondary | ICD-10-CM

## 2012-11-09 DIAGNOSIS — IMO0001 Reserved for inherently not codable concepts without codable children: Secondary | ICD-10-CM

## 2012-11-09 NOTE — Progress Notes (Signed)
  Subjective:    Patient ID: Margaret [name], female    DOB: 11-28-1970, 42 y.o.   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 At our last visit we added low dose prednisone, and she had immediate pain relief within 45 minutes of the first dose. This has been very effective for her, and she has actually been able to taper this down to where she takes 1/2 dose 2 or 3 days a week. She saw a rheumatologist at [facility] who did more lab tests which all returned as normal. She felt her symptoms were all consistent with fibromyalgia and sent her back to see me. As we said last time, she has 2 distinct symptom complexes. One is dull diffuse muscle aches and the other is sharp pains in the joints. These pains respond dramatically to prednisone, while the aches do not.    Review of Systems  Constitutional: Negative.   Musculoskeletal: Positive for myalgias and arthralgias.       Objective:   Physical Exam  Constitutional: She appears well-developed and well-nourished.  Musculoskeletal: Normal range of motion. She exhibits no edema and no tenderness.          Assessment & Plan:  The fact that she responds so quickly to steroids indicate to me that she does indeed have an inflammatory arthritis. She will use Prednisone as needed and will stay active

## 2013-02-02 ENCOUNTER — Encounter: Payer: Self-pay | Admitting: Internal Medicine

## 2013-02-02 ENCOUNTER — Ambulatory Visit (INDEPENDENT_AMBULATORY_CARE_PROVIDER_SITE_OTHER): Payer: BC Managed Care – PPO | Admitting: Internal Medicine

## 2013-02-02 VITALS — BP 120/78 | HR 90 | Temp 98.0°F | Ht 61.0 in | Wt 145.0 lb

## 2013-02-02 DIAGNOSIS — F329 Major depressive disorder, single episode, unspecified: Secondary | ICD-10-CM

## 2013-02-02 DIAGNOSIS — I10 Essential (primary) hypertension: Secondary | ICD-10-CM

## 2013-02-02 DIAGNOSIS — B370 Candidal stomatitis: Secondary | ICD-10-CM

## 2013-02-02 MED ORDER — NYSTATIN 100000 UNIT/ML MT SUSP: 500000.0000 [IU] | Freq: Four times a day (QID) | OROMUCOSAL | Status: DC

## 2013-02-02 NOTE — Assessment & Plan Note (Signed)
stable overall by history and exam, recent data reviewed with pt, and pt to continue medical treatment as before,  to f/u any worsening symptoms or concerns BP Readings from Last 3 Encounters:  02/02/13 120/78  11/09/12 128/76  08/31/12 120/76

## 2013-02-02 NOTE — Patient Instructions (Signed)
Please take all new medication as prescribed Please continue all other medications as before 

## 2013-02-02 NOTE — Assessment & Plan Note (Signed)
Mild to mod, for nystatin soln,  to f/u any worsening symptoms or concerns 

## 2013-02-02 NOTE — Assessment & Plan Note (Signed)
stable overall by history and exam, and pt to continue medical treatment as before,  to f/u any worsening symptoms or concerns, to f/u PCP

## 2013-02-02 NOTE — Progress Notes (Signed)
Subjective:    Patient ID: Margaret [name], female    DOB: 05/31/1971, 42 y.o.   MRN: [id number]  HPI  Here with 1 wk worsening thrush to tongue, not better with nystatin ointment per dentist.  Tongue with mild discomfort but no swelling.  Had recent vaginal yeast infxn and wondering if related.     Pt denies fever, wt loss, night sweats, loss of appetite, or other constitutional symptoms. Pt denies chest pain, increased sob or doe, wheezing, orthopnea, PND, increased LE swelling, palpitations, dizziness or syncope.  Denies worsening depressive symptoms, suicidal ideation, or panic; has ongoing anxiety Past Medical History  Diagnosis Date  . Fibromyalgia   . Depression   . Hyperglycemia   . Urinary incontinence   . Hypertension   . GERD (gastroesophageal reflux disease)   . Allergy   . ALLERGIC RHINITIS 10/23/2009  . DEPRESSION 10/24/2009  . FIBROMYALGIA 10/23/2009  . GERD 10/23/2009  . Headache(784.0) 10/23/2009  . HYPERGLYCEMIA 10/23/2009  . HYPERTENSION 10/23/2009  . Arthritis     sees Dr. Viviann Spare at [facility]   . Chronic pain disorder     sees Dr. Ginger Organ at the [facility] clinic in [place], [place] [phone])   Past Surgical History  Procedure Laterality Date  . Cervical fusion  2005    at C5 and C6  . Tonsilectomy, adenoidectomy, bilateral myringotomy and tubes    . Abdominal hysterectomy  2000    with ovaries intact    reports that she has been smoking Cigarettes.  She has been smoking about 0.50 packs per day. She has never used smokeless tobacco. She reports that she does not drink alcohol or use illicit drugs. family history includes Coronary artery disease in an other family member; Diabetes in her father and mother; Heart disease in her mother; Hypertension in her brother, mother, and paternal aunt; Kidney disease in her father. Allergies  Allergen Reactions  . Azithromycin   . Morphine And Related Other (See Comments)    Makes her "crazy" elevates  blood pressure   Current Outpatient Prescriptions on File Prior to Visit  Medication Sig Dispense Refill  . cetirizine (ZYRTEC) 10 MG tablet Take 10 mg by mouth daily.        . citalopram (CELEXA) 20 MG tablet Take 1 tablet (20 mg total) by mouth daily.  30 tablet  11  . clonazePAM (KLONOPIN) 0.5 MG tablet Take 0.5 mg by mouth 3 (three) times daily as needed.      . Diclofenac Sodium 3 % GEL Place 30 mg onto the skin daily as needed.       Marland Kitchen HYDROcodone-acetaminophen (NORCO) 10-325 MG per tablet Take 1 tablet by mouth 4 (four) times daily as needed.        Marland Kitchen lisinopril-hydrochlorothiazide (PRINZIDE,ZESTORETIC) 10-12.5 MG per tablet Take 0.5 tablets by mouth daily.      . magnesium hydroxide (MILK OF MAGNESIA) 400 MG/5ML suspension Take by mouth as needed.        . methadone (DOLOPHINE) 5 MG tablet Take 10 mg by mouth every 8 (eight) hours. Take twice per day      . NON FORMULARY Probiotics  Once daily      . predniSONE (DELTASONE) 10 MG tablet Take 1 tablet (10 mg total) by mouth daily.  30 tablet  11  . zolpidem (AMBIEN) 10 MG tablet Take 5 mg by mouth at bedtime as needed. Take 1/2 tablet at night prn      .  omeprazole (PRILOSEC) 40 MG capsule Take 20 mg by mouth 2 (two) times daily.       No current facility-administered medications on file prior to visit.   Review of Systems All otherwise neg per pt     Objective:   Physical Exam BP 120/78  Pulse 90  Temp(Src) 98 F (36.7 C) (Oral)  Ht 5\' 1"  (1.549 m)  Wt 145 lb (65.772 kg)  BMI 27.41 kg/m2  SpO2 96% VS noted,  Constitutional: Pt appears well-developed and well-nourished.  HENT: Head: NCAT.  Right Ear: External ear normal.  Left Ear: External ear normal.  Eyes: Conjunctivae and EOM are normal. Pupils are equal, round, and reactive to light.  Neck: Normal range of motion. Neck supple.  Tongue with whitish coating Cardiovascular: Normal rate and regular rhythm.   Pulmonary/Chest: Effort normal and breath sounds normal.   Neurological: Pt is alert. Not confused  Skin: Skin is warm. No erythema.  Psychiatric: Pt behavior is normal. Thought content normal. 1-2+ nervous    Assessment & Plan:

## 2013-03-17 ENCOUNTER — Ambulatory Visit (INDEPENDENT_AMBULATORY_CARE_PROVIDER_SITE_OTHER): Payer: BC Managed Care – PPO | Admitting: Family Medicine

## 2013-03-17 ENCOUNTER — Encounter: Payer: Self-pay | Admitting: Family Medicine

## 2013-03-17 VITALS — BP 104/68 | HR 75 | Temp 98.4°F | Wt 149.0 lb

## 2013-03-17 DIAGNOSIS — R5381 Other malaise: Secondary | ICD-10-CM

## 2013-03-17 DIAGNOSIS — I10 Essential (primary) hypertension: Secondary | ICD-10-CM

## 2013-03-17 DIAGNOSIS — G609 Hereditary and idiopathic neuropathy, unspecified: Secondary | ICD-10-CM

## 2013-03-17 DIAGNOSIS — IMO0001 Reserved for inherently not codable concepts without codable children: Secondary | ICD-10-CM

## 2013-03-17 LAB — CBC WITH DIFFERENTIAL/PLATELET
Basophils Relative: 0.6 % (ref 0.0–3.0)
Eosinophils Relative: 1.3 % (ref 0.0–5.0)
Hemoglobin: 14.2 g/dL (ref 12.0–15.0)
Lymphocytes Relative: 31.9 % (ref 12.0–46.0)
Monocytes Relative: 7.9 % (ref 3.0–12.0)
Neutro Abs: 3.5 10*3/uL (ref 1.4–7.7)
RBC: 4.51 Mil/uL (ref 3.87–5.11)
WBC: 6 10*3/uL (ref 4.5–10.5)

## 2013-03-17 LAB — BASIC METABOLIC PANEL
BUN: 8 mg/dL (ref 6–23)
GFR: 84.19 mL/min (ref >60.00)
Potassium: 4.1 mEq/L (ref 3.5–5.1)
Sodium: 130 mEq/L — ABNORMAL LOW (ref 135–145)

## 2013-03-17 LAB — POCT URINALYSIS DIPSTICK
Bilirubin, UA: NEGATIVE
Ketones, UA: NEGATIVE
Leukocytes, UA: NEGATIVE
pH, UA: 7

## 2013-03-17 LAB — TSH: TSH: 1.78 u[IU]/mL (ref 0.35–5.50)

## 2013-03-17 LAB — HEPATIC FUNCTION PANEL
AST: 18 U/L (ref 0–37)
Total Bilirubin: 0.4 mg/dL (ref 0.3–1.2)

## 2013-03-17 MED ORDER — MAGIC MOUTHWASH: 5.0000 mL | Freq: Four times a day (QID) | ORAL | Status: DC | PRN

## 2013-03-17 MED ORDER — LISINOPRIL 10 MG PO TABS: 10.0000 mg | ORAL_TABLET | Freq: Every day | ORAL | Status: DC

## 2013-03-17 NOTE — Progress Notes (Signed)
  Subjective:    Patient ID: Margaret [name], female    DOB: 04-01-1971, 42 y.o.   MRN: [id number]  HPI Here for several months of symptoms like fatigue, increased thirst, mouth soreness, dry eyes, and constipation. She has been on Lisinopril HCT for several years for HTN. She saw her optometrist, Tobias Alexander, about 3 weeks ago and she diagnosed her with dry eyes syndrome. She gave her some steroid eye drops for a week and then Restasis drops. She takes Prednisone occasionally for her joint pains but only 3 or 4 days a month.    Review of Systems  Constitutional: Positive for fatigue.  HENT: Negative.   Eyes: Negative.   Respiratory: Negative.   Cardiovascular: Negative.        Objective:   Physical Exam  Constitutional: She appears well-developed and well-nourished.  HENT:  Left Ear: External ear normal.  Nose: Nose normal.  Mouth/Throat: Oropharynx is clear and moist. No oropharyngeal exudate.  Eyes: Conjunctivae are normal. Pupils are equal, round, and reactive to light.  Neck: No thyromegaly present.  Cardiovascular: Normal rate, regular rhythm, normal heart sounds and intact distal pulses.   Pulmonary/Chest: Effort normal and breath sounds normal.  Lymphadenopathy:    She has no cervical adenopathy.          Assessment & Plan:  It is not clear what her symptoms may be from. We will take the diuretic out of her BP pill and switch to plain Lisinopril. Try Magic Mouthwash prn. Get labs today

## 2013-03-21 NOTE — Progress Notes (Signed)
Quick Note:  I released results in my chart. ______ 

## 2013-04-15 ENCOUNTER — Telehealth: Payer: Self-pay | Admitting: Family Medicine

## 2013-04-15 DIAGNOSIS — K5900 Constipation, unspecified: Secondary | ICD-10-CM

## 2013-04-15 NOTE — Telephone Encounter (Signed)
Pt has been sick on stomach for about 1 month, suffers from chronic constipation and acid reflux.

## 2013-04-15 NOTE — Telephone Encounter (Signed)
Pt states if her gi symptoms did not get better that Dr Margaret [name] would refer her to a GI doc. Pls advise.

## 2013-04-15 NOTE — Telephone Encounter (Signed)
What GI problems is she having? There is nothing documented in her chart.

## 2013-04-18 NOTE — Telephone Encounter (Signed)
I spoke with pt  

## 2013-04-18 NOTE — Telephone Encounter (Signed)
The referral was done  

## 2013-04-28 ENCOUNTER — Encounter: Payer: Self-pay | Admitting: Gastroenterology

## 2013-04-28 ENCOUNTER — Other Ambulatory Visit: Payer: Self-pay

## 2013-05-25 ENCOUNTER — Encounter: Payer: Self-pay | Admitting: Gastroenterology

## 2013-05-25 ENCOUNTER — Ambulatory Visit (INDEPENDENT_AMBULATORY_CARE_PROVIDER_SITE_OTHER): Payer: BC Managed Care – PPO | Admitting: Gastroenterology

## 2013-05-25 VITALS — BP 120/68 | HR 92 | Ht 61.0 in | Wt 149.0 lb

## 2013-05-25 DIAGNOSIS — K5900 Constipation, unspecified: Secondary | ICD-10-CM

## 2013-05-25 MED ORDER — PEG-KCL-NACL-NASULF-NA ASC-C 100 G PO SOLR: 1.0000 | Freq: Once | ORAL | Status: DC

## 2013-05-25 NOTE — Progress Notes (Signed)
    History of Present Illness: This is a 42 year old female who relates a many year history of constipation. She is on chronic pain medication followed in the pain clinic. She tried MiraLax but this is not effective after she uses it for. She was started on Amitza yesterday by her pain clinic physician. Denies weight loss, abdominal pain, diarrhea, change in stool caliber, melena, hematochezia, nausea, vomiting, dysphagia, reflux symptoms, chest pain.  Review of Systems: Pertinent positive and negative review of systems were noted in the above HPI section. All other review of systems were otherwise negative.  Current Medications, Allergies, Past Medical History, Past Surgical History, Family History and Social History were reviewed in [employer] record.  Physical Exam: General: Well developed , well nourished, no acute distress Head: Normocephalic and atraumatic Eyes:  sclerae anicteric, EOMI Ears: Normal auditory acuity Mouth: No deformity or lesions Neck: Supple, no masses or thyromegaly Lungs: Clear throughout to auscultation Heart: Regular rate and rhythm; no murmurs, rubs or bruits Abdomen: Soft, non tender and non distended. No masses, hepatosplenomegaly or hernias noted. Normal Bowel sounds Rectal: deferred to colonoscopy Musculoskeletal: Symmetrical with no gross deformities  Skin: No lesions on visible extremities Pulses:  Normal pulses noted Extremities: No clubbing, cyanosis, edema or deformities noted Neurological: Alert oriented x 4, grossly nonfocal Cervical Nodes:  No significant cervical adenopathy Inguinal Nodes: No significant inguinal adenopathy Psychological:  Alert and cooperative. Normal mood and affect  Assessment and Recommendations:  1. Constipation. Likely related to chronic narcotic usage. Agree with trial Amitiza. May use milk of magnesia twice a day when necessary in addition to help and his constipation. Schedule colonoscopy. The  risks, benefits, and alternatives to colonoscopy with possible biopsy and possible polypectomy were discussed with the patient and they consent to proceed.

## 2013-05-25 NOTE — Patient Instructions (Signed)
Continue Amitiza and you can add Milk of Magnesia twice daily if needed for constipation.  You have been scheduled for a colonoscopy with propofol. Please follow written instructions given to you at your visit today.  Please pick up your prep kit at the pharmacy within the next 1-3 days. If you use inhalers (even only as needed), please bring them with you on the day of your procedure. Your physician has requested that you go to www.startemmi.com and enter the access code given to you at your visit today. This web site gives a general overview about your procedure. However, you should still follow specific instructions given to you by our office regarding your preparation for the procedure.  Thank you for choosing me and [facility].  Venita Lick. Pleas Koch., MD., Clementeen Graham

## 2013-05-26 ENCOUNTER — Encounter: Payer: Self-pay | Admitting: Gastroenterology

## 2013-07-26 ENCOUNTER — Encounter: Payer: BC Managed Care – PPO | Admitting: Gastroenterology

## 2013-09-14 ENCOUNTER — Other Ambulatory Visit: Payer: Self-pay | Admitting: Family Medicine

## 2014-01-12 ENCOUNTER — Other Ambulatory Visit: Payer: Self-pay | Admitting: Family Medicine

## 2014-01-24 ENCOUNTER — Ambulatory Visit (INDEPENDENT_AMBULATORY_CARE_PROVIDER_SITE_OTHER): Payer: BC Managed Care – PPO | Admitting: Family Medicine

## 2014-01-24 VITALS — BP 114/78 | HR 74 | Temp 98.1°F | Resp 16 | Ht 62.0 in | Wt 153.0 lb

## 2014-01-24 DIAGNOSIS — H9209 Otalgia, unspecified ear: Secondary | ICD-10-CM

## 2014-01-24 DIAGNOSIS — H6982 Other specified disorders of Eustachian tube, left ear: Secondary | ICD-10-CM

## 2014-01-24 DIAGNOSIS — J309 Allergic rhinitis, unspecified: Secondary | ICD-10-CM

## 2014-01-24 DIAGNOSIS — H6980 Other specified disorders of Eustachian tube, unspecified ear: Secondary | ICD-10-CM

## 2014-01-24 DIAGNOSIS — H9202 Otalgia, left ear: Secondary | ICD-10-CM

## 2014-01-24 MED ORDER — FLUTICASONE PROPIONATE 50 MCG/ACT NA SUSP: 2.0000 | Freq: Every day | NASAL | Status: DC

## 2014-01-24 MED ORDER — AZELASTINE HCL 0.1 % NA SOLN: 2.0000 | Freq: Two times a day (BID) | NASAL | Status: DC

## 2014-01-24 NOTE — Progress Notes (Signed)
Subjective:    Patient ID: Margaret [name], female    DOB: 1971/03/31, 43 y.o.   MRN: [id number] This chart was scribed for Nilda SimmerKristi Harveer Sadler, MD by Evon Slackerrance Branch, ED Scribe. This Patient was seen in room 03 and the patients care was started at 6:34 PM  01/24/2014  Otalgia   Otalgia  Associated symptoms include ear discharge, hearing loss and rhinorrhea. Pertinent negatives include no coughing, headaches or sore throat.  HPI Comments: Margaret [name] is a 43 y.o. female who presents to the [facility] complaining of L ear pain onset 1 week prior. She states she has had associated congestion, clear ear drainage, and sinus pressure. She states she hasn't taken any medication for her symptoms. She states she is having trouble hearing out of her left ear that started this morning. She states that she been having a lot of allergy symptoms lately as well She states she has a hx of ear problems as a child. She denies fever, chills, or sore throat.  Review of Systems  Constitutional: Negative for fever, chills, diaphoresis and fatigue.  HENT: Positive for congestion, ear discharge, ear pain, hearing loss, postnasal drip, rhinorrhea and sinus pressure. Negative for dental problem, drooling, sore throat, trouble swallowing and voice change.   Respiratory: Negative for cough and shortness of breath.   Neurological: Negative for dizziness, facial asymmetry, light-headedness, numbness and headaches.    Past Medical History  Diagnosis Date  . Depression   . Urinary incontinence   . Allergy   . ALLERGIC RHINITIS 10/23/2009  . DEPRESSION 10/24/2009  . FIBROMYALGIA 10/23/2009  . GERD 10/23/2009  . Headache(784.0) 10/23/2009  . HYPERGLYCEMIA 10/23/2009  . HYPERTENSION 10/23/2009  . Arthritis     [facility]   . Chronic pain disorder     se[facility] Dr. Ginger OrganAmanda Zimmerman [facility] in [place], [place] [phone])   Past Surgical History  Procedure  Laterality Date  . Cervical fusion  2005    at C5 and C6  . Tonsilectomy, adenoidectomy, bilateral myringotomy and tubes    . Abdominal hysterectomy  2000    with ovaries intact   Allergies  Allergen Reactions  . Azithromycin   . Morphine And Related Other (See Comments)    Makes her "crazy" elevates blood pressure   Current Outpatient Prescriptions  Medication Sig Dispense Refill  . cetirizine (ZYRTEC) 10 MG tablet Take 10 mg by mouth daily.        . citalopram (CELEXA) 20 MG tablet TAKE 1 TABLET ONCE DAILY.  30 tablet  11  . clonazePAM (KLONOPIN) 0.5 MG tablet Take 0.5 mg by mouth 2 (two) times daily.       . diclofenac sodium (VOLTAREN) 1 % GEL Apply topically as needed.      . Diclofenac Sodium 3 % GEL Place 30 mg onto the skin daily as needed.       Marland Kitchen. HYDROcodone-acetaminophen (NORCO) 10-325 MG per tablet Take 1 tablet by mouth 4 (four) times daily as needed.        Marland Kitchen. lisinopril (PRINIVIL,ZESTRIL) 10 MG tablet Take 1 tablet (10 mg total) by mouth daily.  30 tablet  11  . lubiprostone (AMITIZA) 24 MCG capsule Take 24 mcg by mouth 2 (two) times daily with a meal.      . magnesium hydroxide (MILK OF MAGNESIA) 400 MG/5ML suspension Take by mouth as needed.        . methadone (  DOLOPHINE) 10 MG tablet Take 10 mg by mouth 2 (two) times daily.      . NON FORMULARY Probiotics  Once daily      . predniSONE (DELTASONE) 10 MG tablet TAKE 1 TABLET EACH DAY.  30 tablet  11  . zolpidem (AMBIEN) 10 MG tablet Take 5 mg by mouth at bedtime as needed. Take 1/2 tablet at night prn      . azelastine (ASTELIN) 0.1 % nasal spray Place 2 sprays into both nostrils 2 (two) times daily.  30 mL  12  . fluticasone (FLONASE) 50 MCG/ACT nasal spray 1 spray by Nasal route daily.  16 g  11  . fluticasone (FLONASE) 50 MCG/ACT nasal spray Place 2 sprays into both nostrils daily.  16 g  6  . omeprazole (PRILOSEC) 40 MG capsule Take 20 mg by mouth 2 (two) times daily.       No current facility-administered  medications for this visit.   History   Social History  . Marital Status: Divorced    Spouse Name: N/A    Number of Children: N/A  . Years of Education: N/A   Occupational History  . Not on file.   Social History Main Topics  . Smoking status: Current Every Day Smoker    Types: Cigarettes  . Smokeless tobacco: Never Used     Comment: vapor cigarettes  . Alcohol Use: No  . Drug Use: No  . Sexual Activity: Not on file   Other Topics Concern  . Not on file   Social History Narrative  . No narrative on file        Objective:    BP 114/78  Pulse 74  Temp(Src) 98.1 F (36.7 C) (Oral)  Resp 16  Ht 5\' 2"  (1.575 m)  Wt 153 lb (69.4 kg)  BMI 27.98 kg/m2  SpO2 97%  Physical Exam  Nursing note and vitals reviewed. Constitutional: She is oriented to person, place, and time. She appears well-developed and well-nourished. No distress.  HENT:  Head: Normocephalic and atraumatic.  Right Ear: Tympanic membrane and external ear normal.  Left Ear: Tympanic membrane and external ear normal.  Nose: Mucosal edema and rhinorrhea present. Right sinus exhibits no maxillary sinus tenderness and no frontal sinus tenderness. Left sinus exhibits no maxillary sinus tenderness and no frontal sinus tenderness.  Mouth/Throat: Uvula is midline and mucous membranes are normal. No oropharyngeal exudate, posterior oropharyngeal edema or posterior oropharyngeal erythema.  Some drainage in Oropharynx   Eyes: Conjunctivae and EOM are normal.  Neck: Neck supple.  Cardiovascular: Normal rate, regular rhythm and normal heart sounds.   No murmur heard. Pulmonary/Chest: Effort normal. No respiratory distress. She has no wheezes. She has no rales.  Musculoskeletal: Normal range of motion.  Lymphadenopathy:    She has no cervical adenopathy.  Neurological: She is alert and oriented to person, place, and time. No cranial nerve deficit. She exhibits normal muscle tone. Coordination normal.  Skin: Skin is  warm and dry. She is not diaphoretic.  Psychiatric: She has a normal mood and affect. Her behavior is normal.        Assessment & Plan:   1. Left ear pain   2. Eustachian tube dysfunction, left   3. ALLERGIC RHINITIS    1. L ear pain:  New. Secondary to eustachian tube dysfunction.  Recommend Tylenol or Ibuprofen PRN. 2. L eustachian tube dysfunction; New. Rx for Flonase and Astelin provided. Intolerant to oral antihistamines due to excessive drying. 3.  Allergic Rhinitis: uncontrolled; rx for Flonase and Astelin provided.  Meds ordered this encounter  Medications  . diclofenac sodium (VOLTAREN) 1 % GEL    Sig: Apply topically as needed.  . fluticasone (FLONASE) 50 MCG/ACT nasal spray    Sig: Place 2 sprays into both nostrils daily.    Dispense:  16 g    Refill:  6  . azelastine (ASTELIN) 0.1 % nasal spray    Sig: Place 2 sprays into both nostrils 2 (two) times daily.    Dispense:  30 mL    Refill:  12    No Follow-up on file.   I personally performed the services described in this documentation, which was scribed in my presence.  The recorded information has been reviewed and is accurate.  Nilda Simmer, M.D.  [facility] [address] [place], [place]  [address] [phone] phone [phone] fax

## 2014-01-27 ENCOUNTER — Ambulatory Visit: Payer: BC Managed Care – PPO | Admitting: Family Medicine

## 2014-02-20 ENCOUNTER — Encounter: Payer: Self-pay | Admitting: Family Medicine

## 2014-02-20 ENCOUNTER — Ambulatory Visit (INDEPENDENT_AMBULATORY_CARE_PROVIDER_SITE_OTHER): Payer: BC Managed Care – PPO | Admitting: Family Medicine

## 2014-02-20 VITALS — BP 124/80 | HR 69 | Temp 98.5°F | Ht 62.0 in | Wt 156.0 lb

## 2014-02-20 DIAGNOSIS — F3289 Other specified depressive episodes: Secondary | ICD-10-CM

## 2014-02-20 DIAGNOSIS — I10 Essential (primary) hypertension: Secondary | ICD-10-CM

## 2014-02-20 DIAGNOSIS — IMO0001 Reserved for inherently not codable concepts without codable children: Secondary | ICD-10-CM

## 2014-02-20 DIAGNOSIS — J309 Allergic rhinitis, unspecified: Secondary | ICD-10-CM

## 2014-02-20 DIAGNOSIS — M129 Arthropathy, unspecified: Secondary | ICD-10-CM

## 2014-02-20 DIAGNOSIS — K219 Gastro-esophageal reflux disease without esophagitis: Secondary | ICD-10-CM

## 2014-02-20 DIAGNOSIS — F329 Major depressive disorder, single episode, unspecified: Secondary | ICD-10-CM

## 2014-02-20 DIAGNOSIS — R7309 Other abnormal glucose: Secondary | ICD-10-CM

## 2014-02-20 MED ORDER — MELOXICAM 15 MG PO TABS
15.0000 mg | ORAL_TABLET | Freq: Every day | ORAL | Status: DC
Start: 2014-02-20 — End: 2015-01-22

## 2014-02-20 MED ORDER — OMEPRAZOLE 40 MG PO CPDR: 40.0000 mg | DELAYED_RELEASE_CAPSULE | Freq: Two times a day (BID) | ORAL | Status: DC

## 2014-02-20 MED ORDER — LISINOPRIL 10 MG PO TABS: 10.0000 mg | ORAL_TABLET | Freq: Every day | ORAL | Status: DC

## 2014-02-20 MED ORDER — PREDNISONE 10 MG PO TABS: ORAL_TABLET | ORAL | Status: DC

## 2014-02-20 MED ORDER — IPRATROPIUM BROMIDE 0.03 % NA SOLN
2.0000 | Freq: Four times a day (QID) | NASAL | Status: DC
Start: 2014-02-20 — End: 2015-01-22

## 2014-02-20 MED ORDER — CLONAZEPAM 0.5 MG PO TABS: 0.5000 mg | ORAL_TABLET | Freq: Two times a day (BID) | ORAL | Status: DC

## 2014-02-20 MED ORDER — LUBIPROSTONE 24 MCG PO CAPS: 24.0000 ug | ORAL_CAPSULE | Freq: Two times a day (BID) | ORAL | Status: DC

## 2014-02-20 MED ORDER — CITALOPRAM HYDROBROMIDE 10 MG PO TABS: 10.0000 mg | ORAL_TABLET | Freq: Every day | ORAL | Status: DC

## 2014-02-20 NOTE — Progress Notes (Signed)
   Subjective:    Patient ID: Margaret [name], female    DOB: 1971/04/14, 43 y.o.   MRN: [id number]  HPI Here to follow up. She still sees her pain management doctor and the fibromyalgia is stable. However her arthritis has gotten worse. She has joint stiffness and pain most days and some days are worse than others. She takes a Prednisone several days a week.    Review of Systems  Constitutional: Positive for fatigue.  Respiratory: Negative.   Cardiovascular: Negative.   Musculoskeletal: Positive for arthralgias and myalgias.       Objective:   Physical Exam  Constitutional: She appears well-developed and well-nourished.  Cardiovascular: Normal rate, regular rhythm, normal heart sounds and intact distal pulses.   Pulmonary/Chest: Effort normal and breath sounds normal.  Musculoskeletal: Normal range of motion. She exhibits no edema.  Tenderness in both hands           Assessment & Plan:  Try daily Meloxicam and try to use Prednisone as little as possible. Set up fasting labs soon.

## 2014-02-20 NOTE — Progress Notes (Signed)
Pre visit review using our clinic review tool, if applicable. No additional management support is needed unless otherwise documented below in the visit note. 

## 2014-02-23 ENCOUNTER — Other Ambulatory Visit (INDEPENDENT_AMBULATORY_CARE_PROVIDER_SITE_OTHER): Payer: BC Managed Care – PPO

## 2014-02-23 ENCOUNTER — Encounter: Payer: Self-pay | Admitting: *Deleted

## 2014-02-23 DIAGNOSIS — R7309 Other abnormal glucose: Secondary | ICD-10-CM

## 2014-02-23 LAB — CBC WITH DIFFERENTIAL/PLATELET
Basophils Absolute: 0 10*3/uL (ref 0.0–0.1)
Basophils Relative: 0.9 % (ref 0.0–3.0)
EOS ABS: 0.1 10*3/uL (ref 0.0–0.7)
Eosinophils Relative: 1.9 % (ref 0.0–5.0)
HCT: 42.2 % (ref 36.0–46.0)
HEMOGLOBIN: 14.3 g/dL (ref 12.0–15.0)
LYMPHS PCT: 37 % (ref 12.0–46.0)
Lymphs Abs: 1.9 10*3/uL (ref 0.7–4.0)
MCHC: 34 g/dL (ref 30.0–36.0)
MCV: 94.1 fl (ref 78.0–100.0)
MONOS PCT: 8 % (ref 3.0–12.0)
Monocytes Absolute: 0.4 10*3/uL (ref 0.1–1.0)
NEUTROS ABS: 2.7 10*3/uL (ref 1.4–7.7)
Neutrophils Relative %: 52.2 % (ref 43.0–77.0)
Platelets: 235 10*3/uL (ref 150.0–400.0)
RBC: 4.48 Mil/uL (ref 3.87–5.11)
RDW: 13 % (ref 11.5–15.5)
WBC: 5.2 10*3/uL (ref 4.0–10.5)

## 2014-02-23 LAB — LIPID PANEL
Cholesterol: 209 mg/dL — ABNORMAL HIGH (ref 0–200)
HDL: 40.1 mg/dL (ref >39.00)
NONHDL: 168.9
Total CHOL/HDL Ratio: 5
Triglycerides: 210 mg/dL — ABNORMAL HIGH (ref 0.0–149.0)
VLDL: 42 mg/dL — AB (ref 0.0–40.0)

## 2014-02-23 LAB — HEPATIC FUNCTION PANEL
ALK PHOS: 69 U/L (ref 39–117)
ALT: 21 U/L (ref 0–35)
AST: 25 U/L (ref 0–37)
Albumin: 4.3 g/dL (ref 3.5–5.2)
Bilirubin, Direct: 0 mg/dL (ref 0.0–0.3)
Total Bilirubin: 0.7 mg/dL (ref 0.2–1.2)
Total Protein: 7.5 g/dL (ref 6.0–8.3)

## 2014-02-23 LAB — LDL CHOLESTEROL, DIRECT: Direct LDL: 141.7 mg/dL

## 2014-02-23 LAB — TSH: TSH: 6.66 u[IU]/mL — ABNORMAL HIGH (ref 0.35–4.50)

## 2014-02-23 LAB — HEMOGLOBIN A1C: Hgb A1c MFr Bld: 5.6 % (ref 4.6–6.5)

## 2014-02-23 LAB — POCT URINALYSIS DIPSTICK
Bilirubin, UA: NEGATIVE
GLUCOSE UA: NEGATIVE
Ketones, UA: NEGATIVE
LEUKOCYTES UA: NEGATIVE
Nitrite, UA: NEGATIVE
Protein, UA: NEGATIVE
RBC UA: NEGATIVE
Spec Grav, UA: 1.01
Urobilinogen, UA: 0.2
pH, UA: 6.5

## 2014-02-23 LAB — BASIC METABOLIC PANEL
BUN: 13 mg/dL (ref 6–23)
CO2: 29 mEq/L (ref 19–32)
Calcium: 9.8 mg/dL (ref 8.4–10.5)
Chloride: 100 mEq/L (ref 96–112)
Creatinine, Ser: 1.2 mg/dL (ref 0.4–1.2)
GFR: 54.35 mL/min — AB (ref >60.00)
GLUCOSE: 76 mg/dL (ref 70–99)
Potassium: 4.1 mEq/L (ref 3.5–5.1)
SODIUM: 138 meq/L (ref 135–145)

## 2014-02-28 MED ORDER — LEVOTHYROXINE SODIUM 75 MCG PO TABS: 75.0000 ug | ORAL_TABLET | Freq: Every day | ORAL | Status: DC

## 2014-03-02 ENCOUNTER — Ambulatory Visit (INDEPENDENT_AMBULATORY_CARE_PROVIDER_SITE_OTHER): Payer: BC Managed Care – PPO | Admitting: Physician Assistant

## 2014-03-02 ENCOUNTER — Encounter: Payer: Self-pay | Admitting: Physician Assistant

## 2014-03-02 VITALS — BP 120/80 | HR 66 | Temp 97.9°F | Resp 18 | Wt 159.2 lb

## 2014-03-02 DIAGNOSIS — J029 Acute pharyngitis, unspecified: Secondary | ICD-10-CM

## 2014-03-02 LAB — POCT RAPID STREP A (OFFICE): Rapid Strep A Screen: NEGATIVE

## 2014-03-02 NOTE — Progress Notes (Signed)
Pre visit review using our clinic review tool, if applicable. No additional management support is needed unless otherwise documented below in the visit note. 

## 2014-03-02 NOTE — Addendum Note (Signed)
Addended by: Azucena Freed on: 03/02/2014 04:32 PM   Modules accepted: Orders

## 2014-03-02 NOTE — Progress Notes (Signed)
Subjective:    Patient ID: Margaret [name], female    DOB: [birth year], 43 y.o.   MRN: [id number]  Sore Throat  This is a new problem. The current episode started in the past 7 days (3 days). The problem has been gradually worsening. Neither side of throat is experiencing more pain than the other. Maximum temperature: says up to 100F. The pain is at a severity of 7/10. The pain is moderate. Associated symptoms include headaches, a hoarse voice and trouble swallowing (painful). Pertinent negatives include no abdominal pain, congestion, coughing, diarrhea, drooling, ear discharge, ear pain, plugged ear sensation, neck pain, shortness of breath, stridor, swollen glands or vomiting. She has had no exposure to strep or mono. Treatments tried: nasal steroid. The treatment provided no relief.      Review of Systems  Constitutional: Positive for fever and chills.  HENT: Positive for hoarse voice, sore throat and trouble swallowing (painful). Negative for congestion, drooling, ear discharge, ear pain, postnasal drip and sinus pressure.   Respiratory: Negative for cough, shortness of breath and stridor.   Cardiovascular: Negative for chest pain.  Gastrointestinal: Negative for nausea, vomiting, abdominal pain and diarrhea.  Musculoskeletal: Negative for neck pain.  Neurological: Positive for headaches. Negative for syncope.  All other systems reviewed and are negative.    Past Medical History  Diagnosis Date  . Depression   . Urinary incontinence   . Allergy   . ALLERGIC RHINITIS 10/23/2009  . DEPRESSION 10/24/2009  . FIBROMYALGIA 10/23/2009  . GERD 10/23/2009  . Headache(784.0) 10/23/2009  . HYPERGLYCEMIA 10/23/2009  . HYPERTENSION 10/23/2009  . Arthritis     sees Dr. Viviann Spare at [facility]   . Chronic pain disorder     sees Dr. Ginger Organ at the [facility] in [place], [place] [phone])    History   Social History  . Marital Status: Divorced    Spouse Name: N/A    Number of Children: N/A  . Years of Education: N/A   Occupational History  . Not on file.   Social History Main Topics  . Smoking status: Current Every Day Smoker    Types: Cigarettes  . Smokeless tobacco: Never Used     Comment: quit  . Alcohol Use: No  . Drug Use: No  . Sexual Activity: Not on file   Other Topics Concern  . Not on file   Social History Narrative  . No narrative on file    Past Surgical History  Procedure Laterality Date  . Cervical fusion  2005    at C5 and C6  . Tonsilectomy, adenoidectomy, bilateral myringotomy and tubes    . Abdominal hysterectomy  2000    with ovaries intact    Family History  Problem Relation Age of Onset  . Coronary artery disease      relative <50  . Hypertension Mother   . Heart disease Mother   . Diabetes Mother   . Diabetes Father   . Kidney disease Father     kidney disease  . Hypertension Brother   . Hypertension Paternal Aunt   . Colon cancer Neg Hx   . Colon polyps Paternal Grandmother     Allergies  Allergen Reactions  . Azithromycin   . Eggs Or Egg-Derived Products   . Morphine And Related Other (See Comments)    Makes her "crazy" elevates blood pressure    Current Outpatient Prescriptions on File Prior to Visit  Medication Sig Dispense Refill  .  cetirizine (ZYRTEC) 10 MG tablet Take 10 mg by mouth daily.        . citalopram (CELEXA) 10 MG tablet Take 1 tablet (10 mg total) by mouth daily.  90 tablet  3  . clonazePAM (KLONOPIN) 0.5 MG tablet Take 1 tablet (0.5 mg total) by mouth 2 (two) times daily.  60 tablet  5  . diclofenac sodium (VOLTAREN) 1 % GEL Apply topically as needed.      . Diclofenac Sodium 3 % GEL Place 30 mg onto the skin daily as needed.       . fluticasone (FLONASE) 50 MCG/ACT nasal spray Place 2 sprays into both nostrils daily.  16 g  6  . HYDROcodone-acetaminophen (NORCO) 10-325 MG per tablet Take 1 tablet by mouth 4 (four) times daily as needed.        Marland Kitchen ipratropium (ATROVENT) 0.03  % nasal spray Place 2 sprays into both nostrils 4 (four) times daily.  90 mL  3  . levothyroxine (SYNTHROID, LEVOTHROID) 75 MCG tablet Take 1 tablet (75 mcg total) by mouth daily.  90 tablet  0  . lisinopril (PRINIVIL,ZESTRIL) 10 MG tablet Take 1 tablet (10 mg total) by mouth daily.  90 tablet  3  . lubiprostone (AMITIZA) 24 MCG capsule Take 1 capsule (24 mcg total) by mouth 2 (two) times daily with a meal.  180 capsule  3  . magnesium hydroxide (MILK OF MAGNESIA) 400 MG/5ML suspension Take by mouth as needed.        . meloxicam (MOBIC) 15 MG tablet Take 1 tablet (15 mg total) by mouth daily.  90 tablet  3  . methadone (DOLOPHINE) 10 MG tablet Take 10 mg by mouth 3 (three) times daily.       . NON FORMULARY Probiotics  Once daily      . omeprazole (PRILOSEC) 40 MG capsule Take 1 capsule (40 mg total) by mouth 2 (two) times daily.  180 capsule  3  . predniSONE (DELTASONE) 10 MG tablet TAKE 1 TABLET EACH DAY. as needed  90 tablet  3  . zolpidem (AMBIEN) 10 MG tablet Take 5 mg by mouth at bedtime as needed. Take 1/2 tablet at night prn      . fluticasone (FLONASE) 50 MCG/ACT nasal spray 1 spray by Nasal route daily.  16 g  11   No current facility-administered medications on file prior to visit.    EXAM: BP 120/80  Pulse 66  Temp(Src) 97.9 F (36.6 C) (Oral)  Resp 18  Wt 159 lb 3.2 oz (72.213 kg)     Objective:   Physical Exam  Nursing note and vitals reviewed. Constitutional: She is oriented to person, place, and time. She appears well-developed and well-nourished. No distress.  HENT:  Head: Normocephalic and atraumatic.  Right Ear: External ear normal.  Left Ear: External ear normal.  Nose: Nose normal.  Mouth/Throat: No oropharyngeal exudate.  Oropharynx is slightly erythematous, no exudate. Bilateral TMs normal. Bilateral frontal and maxillary sinuses non-TTP.  Eyes: Conjunctivae and EOM are normal.  Neck: Normal range of motion. Neck supple.  Cardiovascular: Normal rate,  regular rhythm and intact distal pulses.   Pulmonary/Chest: Effort normal and breath sounds normal. No stridor. No respiratory distress. She has no wheezes. She has no rales. She exhibits no tenderness.  Lymphadenopathy:    She has no cervical adenopathy.  Neurological: She is alert and oriented to person, place, and time.  Skin: Skin is warm and dry. She is not diaphoretic. No  pallor.  Psychiatric: She has a normal mood and affect. Her behavior is normal. Judgment and thought content normal.     Lab Results  Component Value Date   WBC 5.2 02/23/2014   HGB 14.3 02/23/2014   HCT 42.2 02/23/2014   PLT 235.0 02/23/2014   GLUCOSE 76 02/23/2014   CHOL 209* 02/23/2014   TRIG 210.0* 02/23/2014   HDL 40.10 02/23/2014   LDLDIRECT 141.7 02/23/2014   LDLCALC 115* 10/25/2009   ALT 21 02/23/2014   AST 25 02/23/2014   NA 138 02/23/2014   K 4.1 02/23/2014   CL 100 02/23/2014   CREATININE 1.2 02/23/2014   BUN 13 02/23/2014   CO2 29 02/23/2014   TSH 6.66* 02/23/2014   HGBA1C 5.6 02/23/2014        Assessment & Plan:  Aqueelah was seen today for sore throat.  Diagnoses and associated orders for this visit:  Sore throat Comments: Likely viral. Rapid strep negative. Symptomatic care with salt water gargles and sore throat lozenges. Watchful waiting.    Offered magic mouthwash with lidocaine gargles, however pt would prefer to use saltwater gargles for now.  Return precautions provided, and patient handout on sore throat.  Plan to follow up as needed, or for worsening or persistent symptoms despite treatment.  Patient Instructions  Try saltwater gargles and sore throat lozenges in order to help your sore throat symptoms.  Push fluid hydration with water.  If emergency symptoms discussed during visit developed, seek medical attention immediately.  Followup as needed, or for worsening or persistent symptoms despite treatment.

## 2014-03-02 NOTE — Addendum Note (Signed)
Addended by: Azucena Freed on: 03/02/2014 04:36 PM   Modules accepted: Orders

## 2014-03-02 NOTE — Patient Instructions (Addendum)
Try saltwater gargles and sore throat lozenges in order to help your sore throat symptoms.  Push fluid hydration with water.  If emergency symptoms discussed during visit developed, seek medical attention immediately.  Followup as needed, or for worsening or persistent symptoms despite treatment.     Sore Throat A sore throat is a painful, burning, sore, or scratchy feeling of the throat. There may be pain or tenderness when swallowing or talking. You may have other symptoms with a sore throat. These include coughing, sneezing, fever, or a swollen neck. A sore throat is often the first sign of another sickness. These sicknesses may include a cold, flu, strep throat, or an infection called mono. Most sore throats go away without medical treatment.  HOME CARE   Only take medicine as told by your doctor.  Drink enough fluids to keep your pee (urine) clear or pale yellow.  Rest as needed.  Try using throat sprays, lozenges, or suck on hard candy (if older than 4 years or as told).  Sip warm liquids, such as broth, herbal tea, or warm water with honey. Try sucking on frozen ice pops or drinking cold liquids.  Rinse the mouth (gargle) with salt water. Mix 1 teaspoon salt with 8 ounces of water.  Do not smoke. Avoid being around others when they are smoking.  Put a humidifier in your bedroom at night to moisten the air. You can also turn on a hot shower and sit in the bathroom for 5-10 minutes. Be sure the bathroom door is closed. GET HELP RIGHT AWAY IF:   You have trouble breathing.  You cannot swallow fluids, soft foods, or your spit (saliva).  You have more puffiness (swelling) in the throat.  Your sore throat does not get better in 7 days.  You feel sick to your stomach (nauseous) and throw up (vomit).  You have a fever or lasting symptoms for more than 2-3 days.  You have a fever and your symptoms suddenly get worse. MAKE SURE YOU:   Understand these instructions.  Will  watch your condition.  Will get help right away if you are not doing well or get worse. Document Released: 03/18/2008 Document Revised: 03/03/2012 Document Reviewed: 02/15/2012 [employer] Patient Information 2015 [place], [place]. This information is not intended to replace advice given to you by your health care provider. Make sure you discuss any questions you have with your health care provider.

## 2014-03-04 LAB — CULTURE, GROUP A STREP: Organism ID, Bacteria: NORMAL

## 2014-05-29 ENCOUNTER — Encounter: Payer: Self-pay | Admitting: Family Medicine

## 2014-05-29 ENCOUNTER — Other Ambulatory Visit: Payer: Self-pay | Admitting: Family Medicine

## 2014-05-29 DIAGNOSIS — E038 Other specified hypothyroidism: Secondary | ICD-10-CM

## 2014-05-30 ENCOUNTER — Other Ambulatory Visit: Payer: Self-pay | Admitting: Family Medicine

## 2014-05-31 NOTE — Telephone Encounter (Signed)
I ordered another TSH. Call in #[phone] more Synthroid to cover until her visit

## 2014-06-01 ENCOUNTER — Other Ambulatory Visit (INDEPENDENT_AMBULATORY_CARE_PROVIDER_SITE_OTHER): Payer: BC Managed Care – PPO

## 2014-06-01 DIAGNOSIS — E038 Other specified hypothyroidism: Secondary | ICD-10-CM

## 2014-06-01 LAB — TSH: TSH: 2.15 u[IU]/mL (ref 0.35–4.50)

## 2014-06-02 ENCOUNTER — Encounter: Payer: Self-pay | Admitting: Family Medicine

## 2014-06-02 MED ORDER — LEVOTHYROXINE SODIUM 75 MCG PO TABS: 75.0000 ug | ORAL_TABLET | Freq: Every day | ORAL | Status: DC

## 2014-06-27 ENCOUNTER — Encounter: Payer: Self-pay | Admitting: Family Medicine

## 2014-06-27 ENCOUNTER — Telehealth: Payer: Self-pay | Admitting: Family Medicine

## 2014-06-27 NOTE — Telephone Encounter (Signed)
PA for Amitiza was denied.  Patient's plan states formulary alternatives are Linzess, Lactulose solution, and polyethylene glycol oral powder or packets.

## 2014-06-28 NOTE — Telephone Encounter (Signed)
Called and spoke with pt and pt is aware.  

## 2014-06-28 NOTE — Telephone Encounter (Signed)
She sees Dr. Russella DarStark for her constipation so I recommend she ask him to switch her meds

## 2014-09-03 IMAGING — CR DG WRIST COMPLETE 3+V*R*
2 series · 2 of 2 positions shown · non-contrast
Comparison: None.

CLINICAL DATA: Injury, pain.

RIGHT WRIST - COMPLETE 3+ VIEW

[PA]
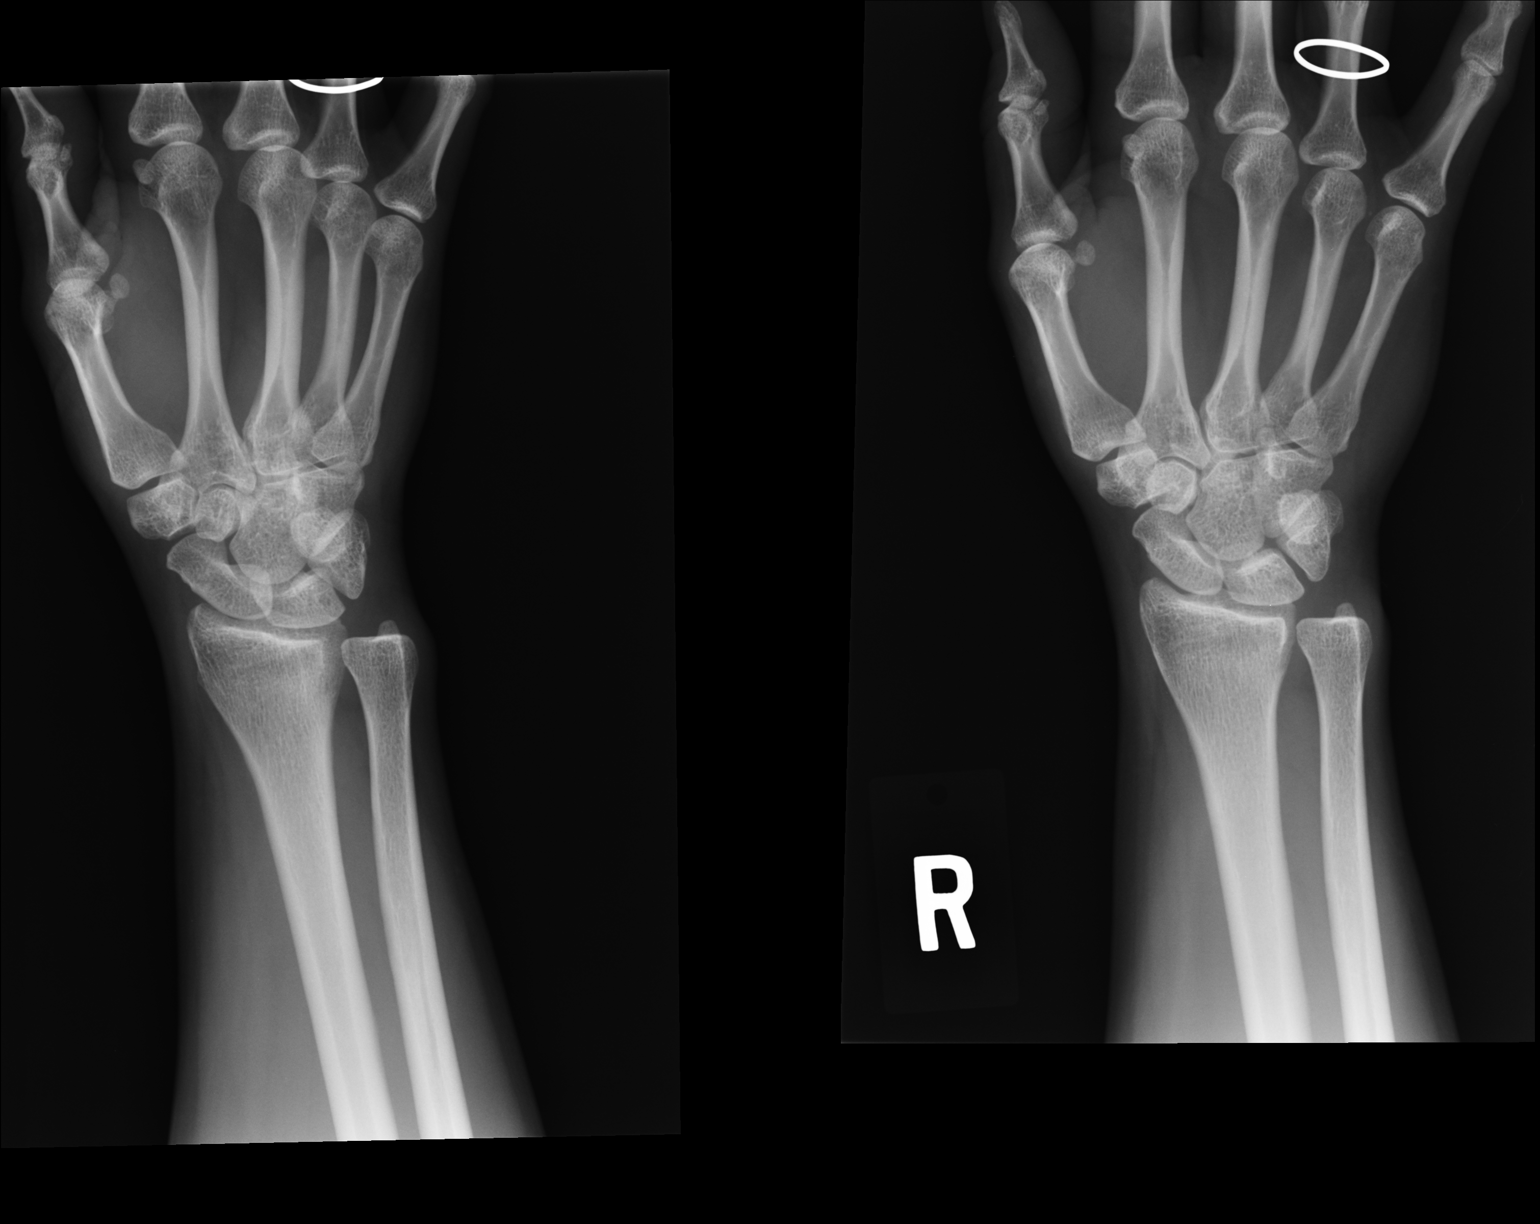

[lateral]
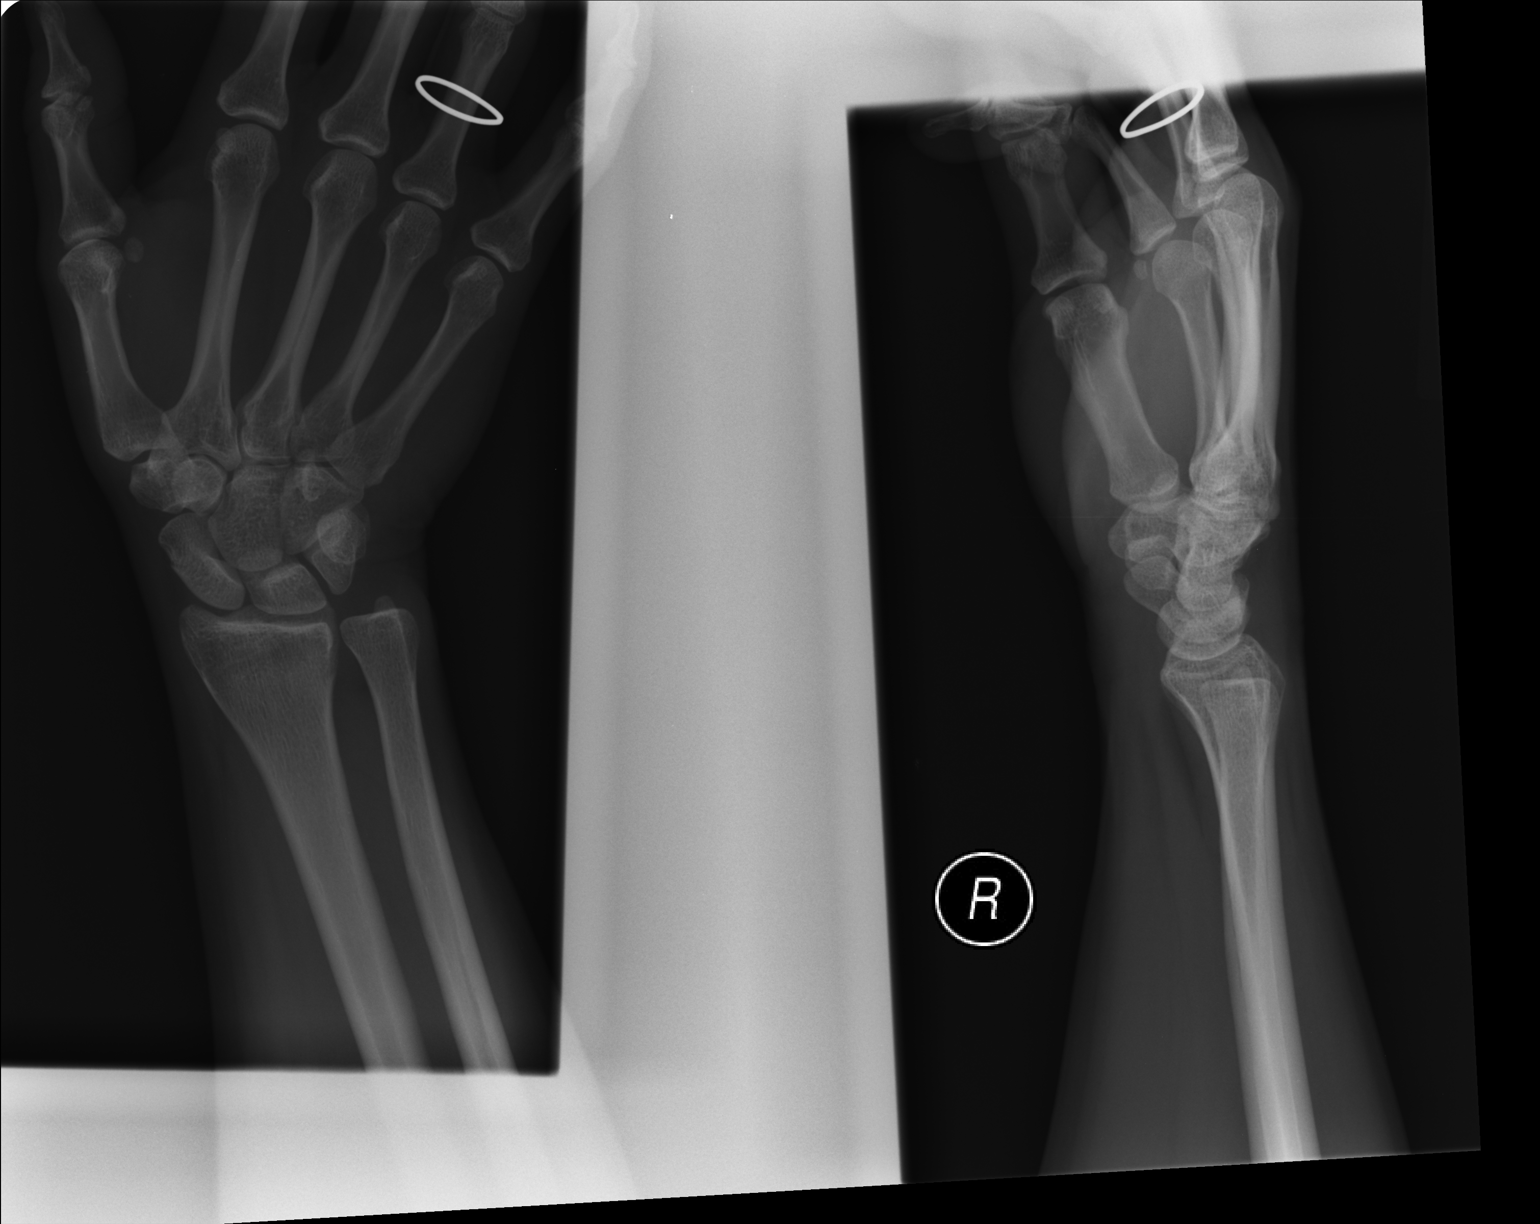

[2 of 2 positions shown; findings below may reference images not displayed]

FINDINGS: On the navicular view, there is suggestion of a linear
lucency through the mid pole of the scaphoid concerning for
nondisplaced scaphoid fracture.  This is not visualized on the
other images.  Recommend correlation for pain in this area.
IMPRESSION: Suspicion for nondisplaced scaphoid mid pole fracture.  Recommend
correlation for pain in this area.

## 2014-11-21 ENCOUNTER — Ambulatory Visit: Payer: Self-pay | Admitting: Family Medicine

## 2014-11-23 ENCOUNTER — Ambulatory Visit (INDEPENDENT_AMBULATORY_CARE_PROVIDER_SITE_OTHER): Payer: BLUE CROSS/BLUE SHIELD | Admitting: Family Medicine

## 2014-11-23 ENCOUNTER — Encounter: Payer: Self-pay | Admitting: Family Medicine

## 2014-11-23 VITALS — BP 117/83 | HR 80 | Temp 97.3°F | Ht 62.0 in | Wt 154.0 lb

## 2014-11-23 DIAGNOSIS — F329 Major depressive disorder, single episode, unspecified: Secondary | ICD-10-CM

## 2014-11-23 DIAGNOSIS — F32A Depression, unspecified: Secondary | ICD-10-CM

## 2014-11-23 DIAGNOSIS — E785 Hyperlipidemia, unspecified: Secondary | ICD-10-CM

## 2014-11-23 DIAGNOSIS — K219 Gastro-esophageal reflux disease without esophagitis: Secondary | ICD-10-CM | POA: Diagnosis not present

## 2014-11-23 DIAGNOSIS — E039 Hypothyroidism, unspecified: Secondary | ICD-10-CM

## 2014-11-23 MED ORDER — VENLAFAXINE HCL ER 37.5 MG PO CP24: 37.5000 mg | ORAL_CAPSULE | Freq: Every day | ORAL | Status: DC

## 2014-11-23 NOTE — Progress Notes (Signed)
Pre visit review using our clinic review tool, if applicable. No additional management support is needed unless otherwise documented below in the visit note. 

## 2014-11-24 ENCOUNTER — Other Ambulatory Visit: Payer: BLUE CROSS/BLUE SHIELD

## 2014-11-24 NOTE — Progress Notes (Signed)
   Subjective:    Patient ID: Margaret [name], female    DOB: 08/12/1970, 44 y.o.   MRN: [id number]  HPI Here for several issues. First her anxiety and depression have been getting worse, and Celexa no longer helps much. Also her GERD has been getting worse despite taking Prilosec bid.    Review of Systems  Constitutional: Negative.   Respiratory: Negative.   Cardiovascular: Negative.   Gastrointestinal: Negative.   Psychiatric/Behavioral: Positive for dysphoric mood and decreased concentration. The patient is nervous/anxious.        Objective:   Physical Exam  Constitutional: She appears well-developed and well-nourished.  Cardiovascular: Normal rate, regular rhythm, normal heart sounds and intact distal pulses.   Pulmonary/Chest: Effort normal and breath sounds normal.  Abdominal: Soft. Bowel sounds are normal. She exhibits no distension and no mass. There is no tenderness. There is no rebound and no guarding.  Psychiatric: She has a normal mood and affect. Her behavior is normal. Thought content normal.          Assessment & Plan:  For her anxiety and depression we will switch to Effexor XR 37.5 mg daily. Refer to GI for the GERD.

## 2014-11-28 ENCOUNTER — Other Ambulatory Visit (INDEPENDENT_AMBULATORY_CARE_PROVIDER_SITE_OTHER): Payer: BLUE CROSS/BLUE SHIELD

## 2014-11-28 DIAGNOSIS — E785 Hyperlipidemia, unspecified: Secondary | ICD-10-CM

## 2014-11-28 DIAGNOSIS — E039 Hypothyroidism, unspecified: Secondary | ICD-10-CM | POA: Diagnosis not present

## 2014-11-28 LAB — LIPID PANEL
CHOL/HDL RATIO: 3
Cholesterol: 132 mg/dL (ref 0–200)
HDL: 39 mg/dL — ABNORMAL LOW (ref >39.00)
LDL CALC: 65 mg/dL (ref 0–99)
NonHDL: 93
TRIGLYCERIDES: 138 mg/dL (ref 0.0–149.0)
VLDL: 27.6 mg/dL (ref 0.0–40.0)

## 2014-11-28 LAB — TSH: TSH: 2.8 u[IU]/mL (ref 0.35–4.50)

## 2015-01-22 ENCOUNTER — Ambulatory Visit (INDEPENDENT_AMBULATORY_CARE_PROVIDER_SITE_OTHER): Payer: BLUE CROSS/BLUE SHIELD | Admitting: Gastroenterology

## 2015-01-22 ENCOUNTER — Encounter: Payer: Self-pay | Admitting: Gastroenterology

## 2015-01-22 VITALS — BP 132/78 | HR 76 | Ht 60.63 in | Wt 152.1 lb

## 2015-01-22 DIAGNOSIS — R1013 Epigastric pain: Secondary | ICD-10-CM

## 2015-01-22 DIAGNOSIS — K219 Gastro-esophageal reflux disease without esophagitis: Secondary | ICD-10-CM

## 2015-01-22 DIAGNOSIS — K5901 Slow transit constipation: Secondary | ICD-10-CM

## 2015-01-22 MED ORDER — LINACLOTIDE 290 MCG PO CAPS: 290.0000 ug | ORAL_CAPSULE | Freq: Every day | ORAL | Status: DC

## 2015-01-22 MED ORDER — PANTOPRAZOLE SODIUM 40 MG PO TBEC: 40.0000 mg | DELAYED_RELEASE_TABLET | Freq: Two times a day (BID) | ORAL | Status: DC

## 2015-01-22 NOTE — Progress Notes (Signed)
    History of Present Illness: This is a 44 year old female referred by Nelwyn Salisbury, MD for the evaluation of GERD, regurgitation, epigastric pain and constipation. She relates problems with significant reflux and regurgitation following meals and at night. Her symptoms are not well controlled on omeprazole 20 mg twice daily. Interestingly she feels that omeprazole 20 mg twice daily better controls her symptoms then omeprazole 40 mg twice daily. She has significant problems with ongoing constipation. She states she gets a dry mouth when she uses milk of magnesia or MiraLAX so she uses them minimally. Amitza was not effective for her so it was discontinued. She was advised to undergo colonoscopy in 05/2013 but did not proceed. Denies weight loss, diarrhea, change in stool caliber, melena, hematochezia, nausea, vomiting, dysphagia, chest pain.  Review of Systems: Pertinent positive and negative review of systems were noted in the above HPI section. All other review of systems were otherwise negative.  Current Medications, Allergies, Past Medical History, Past Surgical History, Family History and Social History were reviewed in [employer] record.  Physical Exam: General: Well developed, well nourished, no acute distress Head: Normocephalic and atraumatic Eyes:  sclerae anicteric, EOMI Ears: Normal auditory acuity Mouth: No deformity or lesions Neck: Supple, no masses or thyromegaly Lungs: Clear throughout to auscultation Heart: Regular rate and rhythm; no murmurs, rubs or bruits Abdomen: Soft, non tender and non distended. No masses, hepatosplenomegaly or hernias noted. Normal Bowel sounds Musculoskeletal: Symmetrical with no gross deformities  Skin: No lesions on visible extremities Pulses:  Normal pulses noted Extremities: No clubbing, cyanosis, edema or deformities noted Neurological: Alert oriented x 4, grossly nonfocal Cervical Nodes:  No significant cervical  adenopathy Inguinal Nodes: No significant inguinal adenopathy Psychological:  Alert and cooperative. Normal mood and affect  Assessment and Recommendations:  1. GERD. 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 Rule out esophagitis, ulcer, partial outlet obstruction. Discontinue omeprazole. Begin Protonix 40 mg bid and antireflux measures. EGD. The risks (including bleeding, perforation, infection, missed lesions, medication reactions and possible hospitalization or surgery if complications occur), benefits, and alternatives to endoscopy with possible biopsy and possible dilation were discussed with the patient and they consent to proceed.   2. Constipation, likely narcotic induced. Linzess 290 mcg daily. MiraLAX or milk of magnesia as needed. Schedule colonoscopy. The risks (including bleeding, perforation, infection, missed lesions, medication reactions and possible hospitalization or surgery if complications occur), benefits, and alternatives to colonoscopy with possible biopsy and possible polypectomy were discussed with the patient and they consent to proceed.    cc: Nelwyn Salisbury, MD [address] [place], [place] [address]

## 2015-01-22 NOTE — Patient Instructions (Signed)
We have sent the following medications to your pharmacy for you to pick up at your convenience:  You have been scheduled for an endoscopy. Please follow written instructions given to you at your visit today. If you use inhalers (even only as needed), please bring them with you on the day of your procedure. Your physician has requested that you go to www.startemmi.com and enter the access code given to you at your visit today. This web site gives a general overview about your procedure. However, you should still follow specific instructions given to you by our office regarding your preparation for the procedure.  Patient advised to avoid spicy, acidic, citrus, chocolate, mints, fruit and fruit juices.  Limit the intake of caffeine, alcohol and Soda.  Don't exercise too soon after eating.  Don't lie down within 3-4 hours of eating.  Elevate the head of your bed.  Normal BMI (Body Mass Index- based on height and weight) is between 19 and 25. Your BMI today is Body mass index is 29.1 kg/(m^2). Marland Kitchen Please consider follow up  regarding your BMI with your Primary Care Provider.   Thank you for choosing me and  Gastroenterology.  Venita Lick. Pleas Koch., MD., Clementeen Graham  cc: Gershon Crane, MD

## 2015-03-01 ENCOUNTER — Other Ambulatory Visit: Payer: Self-pay | Admitting: Family Medicine

## 2015-03-16 ENCOUNTER — Ambulatory Visit (AMBULATORY_SURGERY_CENTER): Payer: BLUE CROSS/BLUE SHIELD | Admitting: Gastroenterology

## 2015-03-16 ENCOUNTER — Encounter: Payer: Self-pay | Admitting: Gastroenterology

## 2015-03-16 VITALS — BP 129/69 | HR 77 | Temp 98.7°F | Resp 18 | Ht 60.63 in | Wt 152.0 lb

## 2015-03-16 DIAGNOSIS — K219 Gastro-esophageal reflux disease without esophagitis: Secondary | ICD-10-CM | POA: Diagnosis not present

## 2015-03-16 MED ORDER — SODIUM CHLORIDE 0.9 % IV SOLN: 500.0000 mL | INTRAVENOUS | Status: DC

## 2015-03-16 NOTE — Progress Notes (Signed)
Clarified above note with Dr. Russella Dar.

## 2015-03-16 NOTE — Progress Notes (Signed)
Report to PACU, RN, vss, BBS= Clear.  

## 2015-03-16 NOTE — Op Note (Addendum)
[facility] [address] [place], [address]   ENDOSCOPY PROCEDURE REPORT  PATIENT: Margaret, [name]  MR#: [id number] BIRTHDATE: [birth year] , 44  yrs. old GENDER: female ENDOSCOPIST: Meryl Dare, MD, [facility] REFERRED BY:  Nelwyn Salisbury PROCEDURE DATE:  03/16/2015 PROCEDURE:  EGD, diagnostic ASA CLASS:     Class III INDICATIONS:  history of esophageal reflux. MEDICATIONS: Monitored anesthesia care and Propofol 130 mg IV TOPICAL ANESTHETIC: none DESCRIPTION OF PROCEDURE: After the risks benefits and alternatives of the procedure were thoroughly explained, informed consent was obtained.  The LB [id number] endoscope was introduced through the mouth and advanced to the second portion of the duodenum , Without limitations.  The instrument was slowly withdrawn as the mucosa was fully examined.    ESOPHAGUS: The mucosa of the esophagus appeared normal. STOMACH: The mucosa and folds of the stomach appeared normal. DUODENUM: The duodenal mucosa showed no abnormalities.  Retroflexed views revealed no abnormalities.     The scope was then withdrawn from the patient and the procedure completed.  COMPLICATIONS: There were no immediate complications.  ENDOSCOPIC IMPRESSION: 1.   The EGD appeared normal  RECOMMENDATIONS: 1.  Anti-reflux regimen long term 2.  Continue PPI po bid  eSigned:  Meryl Dare, MD, [facility] 03/16/2015 3:50 PM Revised: 03/16/2015 3:50 PM

## 2015-03-16 NOTE — Progress Notes (Signed)
Patient and caregiver discussing with Dr. Russella Dar that the Protonix does not work as well as over the counter Omeprazole. Per patient she is to take Omeprazole 20 mg BID, per MD conference.

## 2015-03-16 NOTE — Patient Instructions (Signed)
  Continue your Pantoprazole 40 mg twice daily.    YOU HAD AN ENDOSCOPIC PROCEDURE TODAY AT THE [facility]:   Refer to the procedure report that was given to you for any specific questions about what was found during the examination.  If the procedure report does not answer your questions, please call your gastroenterologist to clarify.  If you requested that your care partner not be given the details of your procedure findings, then the procedure report has been included in a sealed envelope for you to review at your convenience later.  YOU SHOULD EXPECT: Some feelings of bloating in the abdomen. Passage of more gas than usual.  Walking can help get rid of the air that was put into your GI tract during the procedure and reduce the bloating. If you had a lower endoscopy (such as a colonoscopy or flexible sigmoidoscopy) you may notice spotting of blood in your stool or on the toilet paper. If you underwent a bowel prep for your procedure, you may not have a normal bowel movement for a few days.  Please Note:  You might notice some irritation and congestion in your nose or some drainage.  This is from the oxygen used during your procedure.  There is no need for concern and it should clear up in a day or so.  SYMPTOMS TO REPORT IMMEDIATELY:    Following upper endoscopy (EGD)  Vomiting of blood or coffee ground material  New chest pain or pain under the shoulder blades  Painful or persistently difficult swallowing  New shortness of breath  Fever of 100F or higher  Black, tarry-looking stools  For urgent or emergent issues, a gastroenterologist can be reached at any hour by calling [phone].   DIET: Your first meal following the procedure should be a small meal and then it is ok to progress to your normal diet. Heavy or fried foods are harder to digest and may make you feel nauseous or bloated.  Likewise, meals heavy in dairy and vegetables can increase bloating.  Drink  plenty of fluids but you should avoid alcoholic beverages for 24 hours.  ACTIVITY:  You should plan to take it easy for the rest of today and you should NOT DRIVE or use heavy machinery until tomorrow (because of the sedation medicines used during the test).    FOLLOW UP: Our staff will call the number listed on your records the next business day following your procedure to check on you and address any questions or concerns that you may have regarding the information given to you following your procedure. If we do not reach you, we will leave a message.  However, if you are feeling well and you are not experiencing any problems, there is no need to return our call.  We will assume that you have returned to your regular daily activities without incident.  If any biopsies were taken you will be contacted by phone or by letter within the next 1-3 weeks.  Please call us at [phone] if you have not heard about the biopsies in 3 weeks.    SIGNATURES/CONFIDENTIALITY: You and/or your care partner have signed paperwork which will be entered into your electronic medical record.  These signatures attest to the fact that that the information above on your After Visit Summary has been reviewed and is understood.  Full responsibility of the confidentiality of this discharge information lies with you and/or your care-partner.

## 2015-03-19 ENCOUNTER — Telehealth: Payer: Self-pay | Admitting: *Deleted

## 2015-03-19 NOTE — Telephone Encounter (Signed)
Message left

## 2015-04-09 ENCOUNTER — Encounter: Payer: Self-pay | Admitting: Family Medicine

## 2015-04-09 ENCOUNTER — Ambulatory Visit (INDEPENDENT_AMBULATORY_CARE_PROVIDER_SITE_OTHER): Payer: BLUE CROSS/BLUE SHIELD | Admitting: Family Medicine

## 2015-04-09 VITALS — BP 152/75 | HR 86 | Temp 98.8°F | Ht 61.0 in | Wt 148.0 lb

## 2015-04-09 DIAGNOSIS — Z0000 Encounter for general adult medical examination without abnormal findings: Secondary | ICD-10-CM | POA: Diagnosis not present

## 2015-04-09 DIAGNOSIS — F329 Major depressive disorder, single episode, unspecified: Secondary | ICD-10-CM | POA: Diagnosis not present

## 2015-04-09 DIAGNOSIS — R0789 Other chest pain: Secondary | ICD-10-CM

## 2015-04-09 DIAGNOSIS — F411 Generalized anxiety disorder: Secondary | ICD-10-CM

## 2015-04-09 DIAGNOSIS — F32A Depression, unspecified: Secondary | ICD-10-CM

## 2015-04-09 DIAGNOSIS — R079 Chest pain, unspecified: Secondary | ICD-10-CM | POA: Insufficient documentation

## 2015-04-09 DIAGNOSIS — M797 Fibromyalgia: Secondary | ICD-10-CM

## 2015-04-09 MED ORDER — ESCITALOPRAM OXALATE 10 MG PO TABS: 10.0000 mg | ORAL_TABLET | Freq: Every day | ORAL | Status: DC

## 2015-04-09 MED ORDER — FLUTICASONE PROPIONATE 50 MCG/ACT NA SUSP: 1.0000 | Freq: Every day | NASAL | Status: DC

## 2015-04-09 MED ORDER — CLONAZEPAM 0.5 MG PO TABS: 0.5000 mg | ORAL_TABLET | Freq: Two times a day (BID) | ORAL | Status: DC

## 2015-04-09 MED ORDER — PREDNISONE 10 MG PO TABS: ORAL_TABLET | ORAL | Status: DC

## 2015-04-09 NOTE — Progress Notes (Signed)
Pre visit review using our clinic review tool, if applicable. No additional management support is needed unless otherwise documented below in the visit note. 

## 2015-04-09 NOTE — Progress Notes (Signed)
   Subjective:    Patient ID: Margaret [name], female    DOB: Sep 27, 1970, 44 y.o.   MRN: [id number]  HPI Here for several issues. First she has been very anxious and somewhat depressed, and she asks if she can try Lexapro. She had been taking Cymbalta and this hlped her anxiety as well as her fibromyalgia, however she ad to stop taking this due to it causing very dry  Eyes. Her eye doctor said her eyes were fine but that this was a medicatoin side effect. Indeed now that she has been off Cymbalta one week her eyes feel back to normal. She asks if I can take over prescribing her Clonazepam from her pain management doctor. They recently told her that they would no longer be able to prescribe this for her. Lastly she has been having some chest and upper back pains off and on this past week. The pains are often worse when she takes deep breaths. She has nbot been SOB however.    Review of Systems  Constitutional: Negative.   Respiratory: Negative.   Cardiovascular: Negative for palpitations and leg swelling.  Musculoskeletal: Positive for myalgias.  Psychiatric/Behavioral: Positive for dysphoric mood and decreased concentration. Negative for suicidal ideas, behavioral problems, confusion, self-injury and agitation. The patient is nervous/anxious.        Objective:   Physical Exam  Constitutional: She is oriented to person, place, and time. She appears well-developed and well-nourished. No distress.  Cardiovascular: Normal rate, regular rhythm, normal heart sounds and intact distal pulses.   EKG normal with a few PVCs   Pulmonary/Chest: Effort normal and breath sounds normal. No respiratory distress. She has no wheezes. She has no rales.  Mildly tender throughout the upper back and the chest   Neurological: She is alert and oriented to person, place, and time.  Psychiatric: Her behavior is normal. Judgment and thought content normal.  Very anxious           Assessment & Plan:  Her anxiety  is out of control now that she has stopped her Cymbalta, and this has triggered a flare of her fibromyalgia. This explains her chest and back pains, her cardiac status is stable. We will start her on Lexapro 10 mg daily. I agreed to supply her Clonazepam. Recheck in 2-3 weeks

## 2015-05-04 ENCOUNTER — Other Ambulatory Visit (INDEPENDENT_AMBULATORY_CARE_PROVIDER_SITE_OTHER): Payer: BLUE CROSS/BLUE SHIELD

## 2015-05-04 DIAGNOSIS — Z0000 Encounter for general adult medical examination without abnormal findings: Secondary | ICD-10-CM

## 2015-05-10 LAB — NICOTINE SCREEN, URINE: Cotinine, Urine: <2 ng/mL

## 2015-06-03 ENCOUNTER — Other Ambulatory Visit: Payer: Self-pay | Admitting: Family Medicine

## 2015-07-05 ENCOUNTER — Telehealth: Payer: Self-pay | Admitting: Family Medicine

## 2015-07-05 NOTE — Telephone Encounter (Signed)
[facility] - Client TELEPHONE ADVICE RECORD [facility]  Patient Name: Margaret GravelNA [place]ne  DOB: 28-Aug-1970    Initial Comment Caller states she has experienced blood loss in fingertips for first time last night, fingers were cold, no feeling, and were turning white. after 4 mins turned blue and black. after 30 mins returned to normal. Has experienced really bad hot flashes for past month. Today, fingers are cold again and loosing feeling. Haven't turned white yet.   Nurse Assessment  Nurse: Izola PriceMyers, RN, Cala BradfordKimberly Date/Time Lamount Cohen(Eastern Time): 07/05/2015 6:05:29 PM  Confirm and document reason for call. If symptomatic, describe symptoms. --------Caller states she has experienced blood loss in fingertips for first time last night. Fingers were cold, no feeling, and were turning white. After 5 mins turned blue and black. After 30 mins returned to normal. Has experienced really bad hot flashes for past month. This is daily. Today, fingers are cold again and loosing feeling. Haven't turned white yet. Finger were tingling. Has had the cold feeling in fingers for about a month. When pt holds hands/fingers up, they start feeling numb and cold. Takes levothyroxine, methadone and hydrocodone, lisinopril, omeprazole, zolpidem  Has the patient traveled out of the country within the last 30 days? ---Not Applicable  Does the patient have any new or worsening symptoms? ---Yes  Will a triage be completed? ---Yes  Related visit to physician within the last 2 weeks? ---No  Does the PT have any chronic conditions? (i.e. diabetes, asthma, etc.) ---Yes  List chronic conditions. ---fibromyalgia, htn, thyroid, anxiety  Did the patient indicate they were pregnant? ---No  Is this a behavioral health or substance abuse call? ---No     Guidelines    Guideline Title Affirmed Question Affirmed Notes  Finger Pain [1] Numbness (i.e., loss of sensation) in hand or fingers AND [2] new-onset     Final Disposition User   See Physician within 24 Hours Izola PriceMyers, RN, RaytheonKimberly    Referrals  REFERRED TO PCP OFFICE   Disagree/Comply: Danella Maiersomply

## 2015-07-06 ENCOUNTER — Ambulatory Visit (INDEPENDENT_AMBULATORY_CARE_PROVIDER_SITE_OTHER): Payer: BLUE CROSS/BLUE SHIELD | Admitting: Family Medicine

## 2015-07-06 ENCOUNTER — Encounter: Payer: Self-pay | Admitting: Family Medicine

## 2015-07-06 VITALS — BP 110/80 | HR 86 | Temp 98.4°F | Ht 61.0 in | Wt 149.0 lb

## 2015-07-06 DIAGNOSIS — N951 Menopausal and female climacteric states: Secondary | ICD-10-CM

## 2015-07-06 DIAGNOSIS — R202 Paresthesia of skin: Secondary | ICD-10-CM | POA: Diagnosis not present

## 2015-07-06 DIAGNOSIS — I7300 Raynaud's syndrome without gangrene: Secondary | ICD-10-CM

## 2015-07-06 DIAGNOSIS — G5603 Carpal tunnel syndrome, bilateral upper limbs: Secondary | ICD-10-CM | POA: Diagnosis not present

## 2015-07-06 LAB — CBC WITH DIFFERENTIAL/PLATELET
Basophils Absolute: 0 10*3/uL (ref 0.0–0.1)
Basophils Relative: 0.6 % (ref 0.0–3.0)
EOS ABS: 0.1 10*3/uL (ref 0.0–0.7)
Eosinophils Relative: 1.5 % (ref 0.0–5.0)
HCT: 42.8 % (ref 36.0–46.0)
HEMOGLOBIN: 14.4 g/dL (ref 12.0–15.0)
Lymphocytes Relative: 42.9 % (ref 12.0–46.0)
Lymphs Abs: 2.6 10*3/uL (ref 0.7–4.0)
MCHC: 33.8 g/dL (ref 30.0–36.0)
MCV: 91.7 fl (ref 78.0–100.0)
MONO ABS: 0.6 10*3/uL (ref 0.1–1.0)
Monocytes Relative: 10.1 % (ref 3.0–12.0)
NEUTROS PCT: 44.9 % (ref 43.0–77.0)
Neutro Abs: 2.7 10*3/uL (ref 1.4–7.7)
Platelets: 256 10*3/uL (ref 150.0–400.0)
RBC: 4.66 Mil/uL (ref 3.87–5.11)
RDW: 12.4 % (ref 11.5–15.5)
WBC: 6 10*3/uL (ref 4.0–10.5)

## 2015-07-06 LAB — HEPATIC FUNCTION PANEL
ALBUMIN: 4.5 g/dL (ref 3.5–5.2)
ALT: 22 U/L (ref 0–35)
AST: 23 U/L (ref 0–37)
Alkaline Phosphatase: 79 U/L (ref 39–117)
Bilirubin, Direct: 0.1 mg/dL (ref 0.0–0.3)
Total Bilirubin: 0.4 mg/dL (ref 0.2–1.2)
Total Protein: 6.9 g/dL (ref 6.0–8.3)

## 2015-07-06 LAB — BASIC METABOLIC PANEL
BUN: 7 mg/dL (ref 6–23)
CHLORIDE: 103 meq/L (ref 96–112)
CO2: 30 meq/L (ref 19–32)
CREATININE: 1 mg/dL (ref 0.40–1.20)
Calcium: 10 mg/dL (ref 8.4–10.5)
GFR: 63.47 mL/min (ref >60.00)
Glucose, Bld: 96 mg/dL (ref 70–99)
Potassium: 4.5 mEq/L (ref 3.5–5.1)
Sodium: 140 mEq/L (ref 135–145)

## 2015-07-06 LAB — T4, FREE: FREE T4: 1.01 ng/dL (ref 0.60–1.60)

## 2015-07-06 LAB — T3, FREE: T3 FREE: 4.5 pg/mL — AB (ref 2.3–4.2)

## 2015-07-06 LAB — VITAMIN B12: VITAMIN B 12: 547 pg/mL (ref 211–911)

## 2015-07-06 LAB — TSH: TSH: 0.63 u[IU]/mL (ref 0.35–4.50)

## 2015-07-06 MED ORDER — VERAPAMIL HCL ER 100 MG PO CP24: 100.0000 mg | ORAL_CAPSULE | Freq: Every day | ORAL | Status: DC

## 2015-07-06 NOTE — Progress Notes (Signed)
   Subjective:    Patient ID: Margaret [name], female    DOB: [birth year], 45 y.o.   MRN: [id number]  HPI Here to discuss intermittent episodes in bot hands that started one month ago. At times the tips of all 10 fingers will burn and have tingling or pain, then they turn a white color, then purple, and then they return to pink. She shows me photos of her hands on her cell phone showing these dramatic color changes. This does not involve her feet. She also says that for the past 6 months she has had frequent episodes of numbness or tingling that runs down both arms from the elbows to the hands without any color changes. No weakness of grip. This is often worse when driving or when she extends her arms up over her head. Lastly she has started to have hot flashes with sweats over the past month or so. She is scheduled to have a GYN exam with Eveline KetoKathy Harris NP next month.    Review of Systems  Constitutional: Positive for diaphoresis. Negative for fever and chills.  Respiratory: Negative.   Cardiovascular: Negative.   Skin: Positive for color change. Negative for rash.  Neurological: Positive for numbness. Negative for dizziness, tremors, seizures, syncope, facial asymmetry, speech difficulty, weakness, light-headedness and headaches.  Hematological: Negative.        Objective:   Physical Exam  Constitutional: She appears well-developed and well-nourished.  Neck: No thyromegaly present.  Cardiovascular: Normal rate, regular rhythm, normal heart sounds and intact distal pulses.   Pulmonary/Chest: Effort normal and breath sounds normal.  Lymphadenopathy:    She has no cervical adenopathy.  Neurological: She is alert.  Skin: Skin is warm. No rash noted. No erythema. No pallor.          Assessment & Plan:  First it sounds like she is having menopausal hot flashes so she can try OTC black cohosh for this. She will discuss this at her GYN exam next month as well. 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 We will try her on Verapamil ER 100 mg daily. She will keep her hands warm as much as possible. Also she may have some mild carpal tunnel symptoms predating all this, so she will wear wrist splints in bed at night and during the day as much as possible. Recheck in one month

## 2015-07-06 NOTE — Progress Notes (Signed)
Pre visit review using our clinic review tool, if applicable. No additional management support is needed unless otherwise documented below in the visit note. 

## 2015-07-06 NOTE — Telephone Encounter (Signed)
Patient has appt today at 2:00 with Dr. Clent RidgesFry - Lorain ChildesFYI

## 2015-09-04 ENCOUNTER — Encounter: Payer: Self-pay | Admitting: Family Medicine

## 2015-09-04 ENCOUNTER — Other Ambulatory Visit: Payer: Self-pay | Admitting: Family Medicine

## 2015-09-05 MED ORDER — LISINOPRIL 10 MG PO TABS: 10.0000 mg | ORAL_TABLET | Freq: Every day | ORAL | Status: DC

## 2015-09-25 DIAGNOSIS — M5030 Other cervical disc degeneration, unspecified cervical region: Secondary | ICD-10-CM | POA: Diagnosis not present

## 2015-10-16 ENCOUNTER — Encounter: Payer: Self-pay | Admitting: Family Medicine

## 2015-10-16 ENCOUNTER — Other Ambulatory Visit: Payer: Self-pay | Admitting: *Deleted

## 2015-10-16 NOTE — Telephone Encounter (Signed)
Call in #60 with 5 rf 

## 2015-10-16 NOTE — Telephone Encounter (Signed)
[facility] [address] [phone] clonazePAM (KLONOPIN) 0.5 MG tablet

## 2015-10-17 MED ORDER — CLONAZEPAM 0.5 MG PO TABS: 0.5000 mg | ORAL_TABLET | Freq: Two times a day (BID) | ORAL | Status: DC

## 2015-10-17 NOTE — Telephone Encounter (Signed)
Rx has been call in  

## 2015-10-18 DIAGNOSIS — F419 Anxiety disorder, unspecified: Secondary | ICD-10-CM | POA: Diagnosis not present

## 2015-11-16 ENCOUNTER — Telehealth: Payer: Self-pay | Admitting: *Deleted

## 2015-11-16 ENCOUNTER — Encounter: Payer: Self-pay | Admitting: Family Medicine

## 2015-11-16 ENCOUNTER — Ambulatory Visit (INDEPENDENT_AMBULATORY_CARE_PROVIDER_SITE_OTHER): Payer: BLUE CROSS/BLUE SHIELD | Admitting: Family Medicine

## 2015-11-16 VITALS — BP 120/92 | HR 94 | Temp 98.1°F | Ht 61.0 in | Wt 142.9 lb

## 2015-11-16 DIAGNOSIS — W57XXXA Bitten or stung by nonvenomous insect and other nonvenomous arthropods, initial encounter: Secondary | ICD-10-CM | POA: Diagnosis not present

## 2015-11-16 DIAGNOSIS — T148 Other injury of unspecified body region: Secondary | ICD-10-CM | POA: Diagnosis not present

## 2015-11-16 NOTE — Progress Notes (Signed)
HPI:  Acute visit for insect bite: -was working in yard last week and got bit on shoulder -area swelled up and got itchy and then has been improving and actually is much better today -however, still has red itchy area -no tick bites, fevers, malaise, breathing difficulty, rash elsewhere  ROS: See pertinent positives and negatives per HPI.  Past Medical History  Diagnosis Date  . Depression   . Urinary incontinence   . Allergy   . ALLERGIC RHINITIS 10/23/2009  . DEPRESSION 10/24/2009  . FIBROMYALGIA 10/23/2009  . GERD 10/23/2009  . Headache(784.0) 10/23/2009  . HYPERGLYCEMIA 10/23/2009  . HYPERTENSION 10/23/2009  . Arthritis     sees Dr. Viviann Spare at [facility]   . Chronic pain disorder     sees Dr. Ginger Organ at the [facility] in [place], [place] [phone])    Past Surgical History  Procedure Laterality Date  . Cervical fusion  2005    at C5 and C6  . Tonsilectomy, adenoidectomy, bilateral myringotomy and tubes    . Abdominal hysterectomy  2000    with ovaries intact    Family History  Problem Relation Age of Onset  . Coronary artery disease      relative <50  . Hypertension Mother   . Heart disease Mother   . Diabetes Mother   . Diabetes Father   . Kidney disease Father     kidney disease  . Hypertension Brother   . Hypertension Paternal Aunt   . Colon cancer Neg Hx   . Colon polyps Paternal Grandmother     Social History   Social History  . Marital Status: Divorced    Spouse Name: N/A  . Number of Children: N/A  . Years of Education: N/A   Social History Main Topics  . Smoking status: Former Smoker    Types: E-cigarettes  . Smokeless tobacco: Never Used     Comment: quit 12/2011  . Alcohol Use: No  . Drug Use: No  . Sexual Activity: Not Asked   Other Topics Concern  . None   Social History Narrative     Current outpatient prescriptions:  .  clonazePAM (KLONOPIN) 0.5 MG tablet, Take 1 tablet (0.5 mg total) by mouth 2  (two) times daily., Disp: 60 tablet, Rfl: 5 .  Diclofenac Sodium 3 % GEL, Place 30 mg onto the skin daily as needed. , Disp: , Rfl:  .  HYDROcodone-acetaminophen (NORCO) 10-325 MG per tablet, Take 1 tablet by mouth 4 (four) times daily as needed.  , Disp: , Rfl:  .  levothyroxine (SYNTHROID, LEVOTHROID) 75 MCG tablet, TAKE 1 TABLET DAILY., Disp: 90 tablet, Rfl: 1 .  lisinopril (PRINIVIL,ZESTRIL) 10 MG tablet, Take 1 tablet (10 mg total) by mouth daily., Disp: 90 tablet, Rfl: 1 .  magnesium hydroxide (MILK OF MAGNESIA) 400 MG/5ML suspension, Take by mouth as needed.  , Disp: , Rfl:  .  methadone (DOLOPHINE) 10 MG tablet, Take 10 mg by mouth 3 (three) times daily. , Disp: , Rfl:  .  Methylnaltrexone Bromide (RELISTOR) 8 MG/0.4ML SOLN, Inject into the skin as needed., Disp: , Rfl:  .  NON FORMULARY, Probiotics  Once daily, Disp: , Rfl:  .  omeprazole (PRILOSEC OTC) 20 MG tablet, Take 20 mg by mouth 2 (two) times daily., Disp: , Rfl:  .  predniSONE (DELTASONE) 10 MG tablet, TAKE 1 TABLET EACH DAY. as needed, Disp: 90 tablet, Rfl: 3 .  verapamil (VERELAN) 100 MG 24 hr capsule,  Take 1 capsule (100 mg total) by mouth at bedtime., Disp: 30 capsule, Rfl: 2 .  zolpidem (AMBIEN) 10 MG tablet, Take 5 mg by mouth at bedtime as needed. Take 1/2 tablet at night prn, Disp: , Rfl:   EXAM:  Filed Vitals:   11/16/15 1340  BP: 120/92  Pulse: 94  Temp: 98.1 F (36.7 C)    Body mass index is 27.01 kg/(m^2).  GENERAL: vitals reviewed and listed above, alert, oriented, appears well hydrated and in no acute distress  HEENT: atraumatic, conjunttiva clear, no obvious abnormalities on inspection of external nose and ears  NECK: no obvious masses on inspection  SKIM: small patch of erythema R shoulder with small pinpoint in center  MS: moves all extremities without noticeable abnormality  PSYCH: pleasant and cooperative, no obvious depression or anxiety  ASSESSMENT AND PLAN:  Discussed the following  assessment and plan:  Insect bite  -does not appear infected and no signs of significant reaction, improving per pt -topical hydrocortisone cream for itch and return as needed -Patient advised to return or notify a doctor immediately if symptoms worsen or persist or new concerns arise.  Declined avs. Noted elevated BP after she left. Will have assistant advise she do recheck 2-4 weeks.  There are no Patient Instructions on file for this visit.   Kriste BasqueKIM, HANNAH R.

## 2015-11-16 NOTE — Telephone Encounter (Signed)
Per Dr Selena BattenKim she noticed the pts blood pressure was elevated after she left the office.  Dr Selena BattenKim asked that the pt call for an appt to see Dr Clent RidgesFry to follow up on this and I left a detailed message at her cell number for her to call back for an appt.

## 2015-11-16 NOTE — Progress Notes (Signed)
Pre visit review using our clinic review tool, if applicable. No additional management support is needed unless otherwise documented below in the visit note. 

## 2015-11-28 ENCOUNTER — Other Ambulatory Visit: Payer: Self-pay | Admitting: Family Medicine

## 2015-11-29 DIAGNOSIS — F419 Anxiety disorder, unspecified: Secondary | ICD-10-CM | POA: Diagnosis not present

## 2015-12-26 DIAGNOSIS — M5030 Other cervical disc degeneration, unspecified cervical region: Secondary | ICD-10-CM | POA: Diagnosis not present

## 2015-12-26 DIAGNOSIS — Z79891 Long term (current) use of opiate analgesic: Secondary | ICD-10-CM | POA: Diagnosis not present

## 2016-01-10 ENCOUNTER — Telehealth: Payer: Self-pay | Admitting: Family Medicine

## 2016-01-10 NOTE — Telephone Encounter (Signed)
Pt states her pain clinic has asked she see if Dr Clent RidgesFry will prescribe her zolpidem (AMBIEN) 10 MG tablet  Pt states she takes 1/2 tab at night and a 90 day last her 6 months  [facility]

## 2016-01-11 MED ORDER — ZOLPIDEM TARTRATE 10 MG PO TABS: 10.0000 mg | ORAL_TABLET | Freq: Every evening | ORAL | Status: DC | PRN

## 2016-01-11 NOTE — Telephone Encounter (Signed)
Call in Zolpidem 10 mg qhs, #90 with no rf

## 2016-01-11 NOTE — Telephone Encounter (Signed)
I called in script and left a voice message for pt with this information.  

## 2016-02-20 ENCOUNTER — Other Ambulatory Visit: Payer: Self-pay | Admitting: Family Medicine

## 2016-03-25 DIAGNOSIS — M5030 Other cervical disc degeneration, unspecified cervical region: Secondary | ICD-10-CM | POA: Diagnosis not present

## 2016-05-06 ENCOUNTER — Other Ambulatory Visit: Payer: Self-pay | Admitting: Family Medicine

## 2016-05-07 NOTE — Telephone Encounter (Signed)
Call in #30 with 5 rf 

## 2016-05-09 ENCOUNTER — Telehealth: Payer: Self-pay | Admitting: Family Medicine

## 2016-05-09 MED ORDER — ZOLPIDEM TARTRATE 10 MG PO TABS: 10.0000 mg | ORAL_TABLET | Freq: Every evening | ORAL | 5 refills | Status: DC | PRN

## 2016-05-09 NOTE — Telephone Encounter (Signed)
[facility]

## 2016-05-09 NOTE — Telephone Encounter (Signed)
I called in script 

## 2016-05-12 ENCOUNTER — Other Ambulatory Visit: Payer: Self-pay | Admitting: Family Medicine

## 2016-05-12 NOTE — Telephone Encounter (Signed)
Call in #60 with 5 rf 

## 2016-05-26 ENCOUNTER — Other Ambulatory Visit: Payer: Self-pay | Admitting: Family Medicine

## 2016-05-29 DIAGNOSIS — N951 Menopausal and female climacteric states: Secondary | ICD-10-CM | POA: Diagnosis not present

## 2016-06-10 DIAGNOSIS — M5030 Other cervical disc degeneration, unspecified cervical region: Secondary | ICD-10-CM | POA: Diagnosis not present

## 2016-07-16 ENCOUNTER — Other Ambulatory Visit: Payer: BLUE CROSS/BLUE SHIELD | Admitting: Family Medicine

## 2016-07-16 ENCOUNTER — Other Ambulatory Visit (INDEPENDENT_AMBULATORY_CARE_PROVIDER_SITE_OTHER): Payer: BLUE CROSS/BLUE SHIELD

## 2016-07-16 DIAGNOSIS — Z0000 Encounter for general adult medical examination without abnormal findings: Secondary | ICD-10-CM | POA: Diagnosis not present

## 2016-07-16 LAB — LIPID PANEL
CHOL/HDL RATIO: 4
CHOLESTEROL: 165 mg/dL (ref 0–200)
HDL: 38.7 mg/dL — ABNORMAL LOW (ref >39.00)
LDL CALC: 87 mg/dL (ref 0–99)
NonHDL: 126.05
TRIGLYCERIDES: 197 mg/dL — AB (ref 0.0–149.0)
VLDL: 39.4 mg/dL (ref 0.0–40.0)

## 2016-07-16 LAB — POC URINALSYSI DIPSTICK (AUTOMATED)
Bilirubin, UA: NEGATIVE
Blood, UA: NEGATIVE
Glucose, UA: NEGATIVE
Ketones, UA: NEGATIVE
Leukocytes, UA: NEGATIVE
NITRITE UA: NEGATIVE
PH UA: 6.5
Protein, UA: NEGATIVE
SPEC GRAV UA: 1.01
UROBILINOGEN UA: 0.2

## 2016-07-16 LAB — CBC WITH DIFFERENTIAL/PLATELET
BASOS ABS: 0 10*3/uL (ref 0.0–0.1)
Basophils Relative: 0.5 % (ref 0.0–3.0)
EOS PCT: 1.3 % (ref 0.0–5.0)
Eosinophils Absolute: 0.1 10*3/uL (ref 0.0–0.7)
HCT: 41.3 % (ref 36.0–46.0)
HEMOGLOBIN: 14.3 g/dL (ref 12.0–15.0)
LYMPHS ABS: 2.6 10*3/uL (ref 0.7–4.0)
LYMPHS PCT: 39.4 % (ref 12.0–46.0)
MCHC: 34.5 g/dL (ref 30.0–36.0)
MCV: 90.5 fl (ref 78.0–100.0)
MONOS PCT: 7.3 % (ref 3.0–12.0)
Monocytes Absolute: 0.5 10*3/uL (ref 0.1–1.0)
NEUTROS PCT: 51.5 % (ref 43.0–77.0)
Neutro Abs: 3.4 10*3/uL (ref 1.4–7.7)
Platelets: 236 10*3/uL (ref 150.0–400.0)
RBC: 4.57 Mil/uL (ref 3.87–5.11)
RDW: 13.2 % (ref 11.5–15.5)
WBC: 6.6 10*3/uL (ref 4.0–10.5)

## 2016-07-16 LAB — BASIC METABOLIC PANEL
BUN: 11 mg/dL (ref 6–23)
CALCIUM: 9.8 mg/dL (ref 8.4–10.5)
CO2: 29 mEq/L (ref 19–32)
Chloride: 97 mEq/L (ref 96–112)
Creatinine, Ser: 0.97 mg/dL (ref 0.40–1.20)
GFR: 65.45 mL/min (ref >60.00)
GLUCOSE: 90 mg/dL (ref 70–99)
POTASSIUM: 3.6 meq/L (ref 3.5–5.1)
SODIUM: 133 meq/L — AB (ref 135–145)

## 2016-07-16 LAB — TSH: TSH: 2.66 u[IU]/mL (ref 0.35–4.50)

## 2016-07-16 LAB — HEPATIC FUNCTION PANEL
ALBUMIN: 4.5 g/dL (ref 3.5–5.2)
ALT: 18 U/L (ref 0–35)
AST: 18 U/L (ref 0–37)
Alkaline Phosphatase: 74 U/L (ref 39–117)
BILIRUBIN TOTAL: 0.7 mg/dL (ref 0.2–1.2)
Bilirubin, Direct: 0.1 mg/dL (ref 0.0–0.3)
Total Protein: 6.9 g/dL (ref 6.0–8.3)

## 2016-07-22 ENCOUNTER — Encounter: Payer: Self-pay | Admitting: Family Medicine

## 2016-07-22 ENCOUNTER — Ambulatory Visit (INDEPENDENT_AMBULATORY_CARE_PROVIDER_SITE_OTHER): Payer: BLUE CROSS/BLUE SHIELD | Admitting: Family Medicine

## 2016-07-22 VITALS — BP 118/86 | HR 76 | Temp 98.1°F | Ht 61.0 in | Wt 142.0 lb

## 2016-07-22 DIAGNOSIS — Z0000 Encounter for general adult medical examination without abnormal findings: Secondary | ICD-10-CM

## 2016-07-22 MED ORDER — LAMOTRIGINE 25 MG PO TABS: 25.0000 mg | ORAL_TABLET | Freq: Every day | ORAL | 2 refills | Status: DC

## 2016-07-22 MED ORDER — BUSPIRONE HCL 7.5 MG PO TABS: 7.5000 mg | ORAL_TABLET | Freq: Two times a day (BID) | ORAL | 0 refills | Status: DC

## 2016-07-22 MED ORDER — TERBINAFINE HCL 250 MG PO TABS: 250.0000 mg | ORAL_TABLET | Freq: Every day | ORAL | 0 refills | Status: DC

## 2016-07-22 NOTE — Progress Notes (Signed)
   Subjective:    Patient ID: Margaret [name], female    DOB: 02/01/1971, 46 y.o.   MRN: [id number]  HPI 46 yr old female for a well exam. She has a number of issues to discuss. First she says that her pain management doctor has decided that she can no longer treat her while she is taking Clonazepam (of note she is the one who started her on this medication several years ago). She apparently is now not allowed to take any type of benzodiazepine. She taking Buspar but this does not help her anxiety very much. She has tried any number of antidepressant medications in the past but they all give her side effects. Also she has toenail fungus and using topical treatments has not helped. She sees her GYN regularly.    Review of Systems  Constitutional: Negative.   HENT: Negative.   Eyes: Negative.   Respiratory: Negative.   Cardiovascular: Negative.   Gastrointestinal: Negative.   Genitourinary: Negative for decreased urine volume, difficulty urinating, dyspareunia, dysuria, enuresis, flank pain, frequency, hematuria, pelvic pain and urgency.  Musculoskeletal: Positive for arthralgias and myalgias.  Skin: Negative.   Neurological: Negative.   Psychiatric/Behavioral: Negative for dysphoric mood. The patient is nervous/anxious.        Objective:   Physical Exam  Constitutional: She is oriented to person, place, and time. She appears well-developed and well-nourished. No distress.  HENT:  Head: Normocephalic and atraumatic.  Right Ear: External ear normal.  Left Ear: External ear normal.  Nose: Nose normal.  Mouth/Throat: Oropharynx is clear and moist. No oropharyngeal exudate.  Eyes: Conjunctivae and EOM are normal. Pupils are equal, round, and reactive to light. No scleral icterus.  Neck: Normal range of motion. Neck supple. No JVD present. No thyromegaly present.  Cardiovascular: Normal rate, regular rhythm, normal heart sounds and intact distal pulses.  Exam reveals no gallop and no friction  rub.   No murmur heard. Pulmonary/Chest: Effort normal and breath sounds normal. No respiratory distress. She has no wheezes. She has no rales. She exhibits no tenderness.  Abdominal: Soft. Bowel sounds are normal. She exhibits no distension and no mass. There is no tenderness. There is no rebound and no guarding.  Musculoskeletal: Normal range of motion. She exhibits no edema or tenderness.  Lymphadenopathy:    She has no cervical adenopathy.  Neurological: She is alert and oriented to person, place, and time. She has normal reflexes. No cranial nerve deficit. She exhibits normal muscle tone. Coordination normal.  Skin: Skin is warm and dry. No rash noted. No erythema.  All toenails are thickened and yellow   Psychiatric: Her behavior is normal. Judgment and thought content normal.  Anxious           Assessment & Plan:  Well exam. We discussed diet and exercise. Try Terbinafine for 90 days for the toenail fungus. As for her anxiety, she will stop the Clonazepem and stay on the Buspar. We will add Lamictal 25 mg qhs. I told her to give me a report in 2 weeks, because we can titrate the dose of this upwards until it kicks in.  Gershon CraneStephen Loreena Valeri, MD

## 2016-07-22 NOTE — Progress Notes (Signed)
Pre visit review using our clinic review tool, if applicable. No additional management support is needed unless otherwise documented below in the visit note. 

## 2016-07-28 ENCOUNTER — Encounter: Payer: Self-pay | Admitting: Family Medicine

## 2016-07-28 NOTE — Telephone Encounter (Signed)
Call in Celexa 20 mg to take daily, #30 with 2 rf. She will need to stop the Buspar and the Lamictal in the meantime.

## 2016-07-29 ENCOUNTER — Other Ambulatory Visit: Payer: Self-pay | Admitting: Family Medicine

## 2016-07-29 MED ORDER — CITALOPRAM HYDROBROMIDE 20 MG PO TABS: 20.0000 mg | ORAL_TABLET | Freq: Every day | ORAL | 2 refills | Status: DC

## 2016-07-29 NOTE — Telephone Encounter (Signed)
I left a voice message for pt to return my call.  

## 2016-07-29 NOTE — Telephone Encounter (Signed)
Pt is returning sylvia call °

## 2016-07-29 NOTE — Telephone Encounter (Signed)
I spoke with pt, sent new script e-scribe and removed other 2 medications from list.

## 2016-08-13 DIAGNOSIS — M961 Postlaminectomy syndrome, not elsewhere classified: Secondary | ICD-10-CM | POA: Diagnosis not present

## 2016-08-13 DIAGNOSIS — Z79891 Long term (current) use of opiate analgesic: Secondary | ICD-10-CM | POA: Diagnosis not present

## 2016-08-13 DIAGNOSIS — M5030 Other cervical disc degeneration, unspecified cervical region: Secondary | ICD-10-CM | POA: Diagnosis not present

## 2016-08-13 DIAGNOSIS — G894 Chronic pain syndrome: Secondary | ICD-10-CM | POA: Diagnosis not present

## 2016-08-20 DIAGNOSIS — M797 Fibromyalgia: Secondary | ICD-10-CM | POA: Diagnosis not present

## 2016-08-21 DIAGNOSIS — N951 Menopausal and female climacteric states: Secondary | ICD-10-CM | POA: Diagnosis not present

## 2016-08-21 DIAGNOSIS — Z6826 Body mass index (BMI) 26.0-26.9, adult: Secondary | ICD-10-CM | POA: Diagnosis not present

## 2016-08-21 DIAGNOSIS — Z01419 Encounter for gynecological examination (general) (routine) without abnormal findings: Secondary | ICD-10-CM | POA: Diagnosis not present

## 2016-08-21 DIAGNOSIS — Z1231 Encounter for screening mammogram for malignant neoplasm of breast: Secondary | ICD-10-CM | POA: Diagnosis not present

## 2016-08-25 DIAGNOSIS — F418 Other specified anxiety disorders: Secondary | ICD-10-CM | POA: Diagnosis not present

## 2016-08-25 DIAGNOSIS — M797 Fibromyalgia: Secondary | ICD-10-CM | POA: Diagnosis not present

## 2016-08-26 DIAGNOSIS — F418 Other specified anxiety disorders: Secondary | ICD-10-CM | POA: Diagnosis not present

## 2016-08-27 ENCOUNTER — Other Ambulatory Visit: Payer: Self-pay | Admitting: Family Medicine

## 2016-09-02 ENCOUNTER — Other Ambulatory Visit: Payer: Self-pay | Admitting: Family Medicine

## 2016-09-02 NOTE — Telephone Encounter (Signed)
Can we refill this? 

## 2016-09-03 DIAGNOSIS — M797 Fibromyalgia: Secondary | ICD-10-CM | POA: Diagnosis not present

## 2016-09-03 DIAGNOSIS — F418 Other specified anxiety disorders: Secondary | ICD-10-CM | POA: Diagnosis not present

## 2016-09-09 DIAGNOSIS — F418 Other specified anxiety disorders: Secondary | ICD-10-CM | POA: Diagnosis not present

## 2016-09-11 DIAGNOSIS — F418 Other specified anxiety disorders: Secondary | ICD-10-CM | POA: Diagnosis not present

## 2016-09-11 DIAGNOSIS — M797 Fibromyalgia: Secondary | ICD-10-CM | POA: Diagnosis not present

## 2016-09-15 DIAGNOSIS — G894 Chronic pain syndrome: Secondary | ICD-10-CM | POA: Diagnosis not present

## 2016-09-15 DIAGNOSIS — M5030 Other cervical disc degeneration, unspecified cervical region: Secondary | ICD-10-CM | POA: Diagnosis not present

## 2016-09-15 DIAGNOSIS — Z79891 Long term (current) use of opiate analgesic: Secondary | ICD-10-CM | POA: Diagnosis not present

## 2016-09-15 DIAGNOSIS — M961 Postlaminectomy syndrome, not elsewhere classified: Secondary | ICD-10-CM | POA: Diagnosis not present

## 2016-09-30 DIAGNOSIS — F418 Other specified anxiety disorders: Secondary | ICD-10-CM | POA: Diagnosis not present

## 2016-10-01 DIAGNOSIS — F418 Other specified anxiety disorders: Secondary | ICD-10-CM | POA: Diagnosis not present

## 2016-10-01 DIAGNOSIS — M797 Fibromyalgia: Secondary | ICD-10-CM | POA: Diagnosis not present

## 2016-10-08 DIAGNOSIS — F418 Other specified anxiety disorders: Secondary | ICD-10-CM | POA: Diagnosis not present

## 2016-10-28 DIAGNOSIS — F418 Other specified anxiety disorders: Secondary | ICD-10-CM | POA: Diagnosis not present

## 2016-10-31 ENCOUNTER — Encounter: Payer: Self-pay | Admitting: Family Medicine

## 2016-10-31 ENCOUNTER — Ambulatory Visit (INDEPENDENT_AMBULATORY_CARE_PROVIDER_SITE_OTHER): Payer: BLUE CROSS/BLUE SHIELD | Admitting: Family Medicine

## 2016-10-31 VITALS — BP 140/98 | HR 77 | Temp 98.2°F | Ht 61.0 in | Wt 140.0 lb

## 2016-10-31 DIAGNOSIS — M797 Fibromyalgia: Secondary | ICD-10-CM

## 2016-10-31 DIAGNOSIS — F324 Major depressive disorder, single episode, in partial remission: Secondary | ICD-10-CM

## 2016-10-31 DIAGNOSIS — F411 Generalized anxiety disorder: Secondary | ICD-10-CM

## 2016-10-31 DIAGNOSIS — K649 Unspecified hemorrhoids: Secondary | ICD-10-CM

## 2016-10-31 DIAGNOSIS — G4700 Insomnia, unspecified: Secondary | ICD-10-CM

## 2016-10-31 DIAGNOSIS — J301 Allergic rhinitis due to pollen: Secondary | ICD-10-CM

## 2016-10-31 MED ORDER — VORTIOXETINE HBR 10 MG PO TABS: 10.0000 mg | ORAL_TABLET | Freq: Every day | ORAL | 2 refills | Status: DC

## 2016-10-31 MED ORDER — ZOLPIDEM TARTRATE ER 6.25 MG PO TBCR: 6.2500 mg | EXTENDED_RELEASE_TABLET | Freq: Every evening | ORAL | 2 refills | Status: DC | PRN

## 2016-10-31 MED ORDER — HYDROCORTISONE ACE-PRAMOXINE 2.5-1 % RE CREA: 1.0000 "application " | TOPICAL_CREAM | Freq: Three times a day (TID) | RECTAL | 2 refills | Status: DC

## 2016-10-31 NOTE — Progress Notes (Signed)
   Subjective:    Patient ID: Margaret [name], female    DOB: June 05, 1971, 46 y.o.   MRN: [id number]  HPI Here to discuss several issues. First her allergies have flared up causing stuffy head and PND. No fever or cough. Second her hemorrhoids have been swollen and painful lately, though they do not bleed. OTC products have not helped. Third she asks if we can take over prescribing her Trintellix. She decided to take herself off Methadone cold [place] and has not used this in 6 weeks. The withdrawals were very difficult for her but they have settled down now. Since she no longer needs to see a pain management clinic, we will mange her other meds now. She also asks about changing her sleep medication. She has been taking Zolpidem 10 mg, 1/2 tab for a year or more, and she falls asleep easily. However she is starting to wake up in the middle if the night lately.    Review of Systems  Constitutional: Negative.   HENT: Positive for congestion, postnasal drip, rhinorrhea, sinus pressure and sneezing.   Respiratory: Negative.   Cardiovascular: Negative.   Gastrointestinal: Positive for rectal pain.  Psychiatric/Behavioral: Positive for sleep disturbance. Negative for agitation and decreased concentration. The patient is not nervous/anxious.        Objective:   Physical Exam  Constitutional: She is oriented to person, place, and time. She appears well-developed and well-nourished.  Cardiovascular: Normal rate, regular rhythm, normal heart sounds and intact distal pulses.   Pulmonary/Chest: Effort normal and breath sounds normal.  Abdominal: Soft. Bowel sounds are normal. She exhibits no distension and no mass. There is no tenderness. There is no rebound and no guarding.  Neurological: She is alert and oriented to person, place, and time.  Psychiatric: She has a normal mood and affect. Her behavior is normal. Thought content normal.          Assessment & Plan:  For the allergies, she will try  Flonase and Xyzal. For the hemorrhoids, she will try Analpram HC cream. Her depression and anxiety are stable, so we will refill the Trintellix. For the insomnia, switch to Ambien CR 6.25 mg qhs.  Gershon CraneStephen Fry, MD

## 2016-10-31 NOTE — Patient Instructions (Signed)
WE NOW OFFER   [facility] FAST TRACK!!!  SAME DAY Appointments for ACUTE CARE  Such as: Sprains, Injuries, cuts, abrasions, rashes, muscle pain, joint pain, back pain Colds, flu, sore throats, headache, allergies, cough, fever  Ear pain, sinus and eye infections Abdominal pain, nausea, vomiting, diarrhea, upset stomach Animal/insect bites  3 Easy Ways to Schedule: Walk-In Scheduling Call in scheduling Mychart Sign-up: [email or web address]         

## 2016-11-04 ENCOUNTER — Telehealth: Payer: Self-pay

## 2016-11-04 DIAGNOSIS — M797 Fibromyalgia: Secondary | ICD-10-CM | POA: Diagnosis not present

## 2016-11-04 NOTE — Telephone Encounter (Signed)
Received PA request for Trintellix, PA approved & form faxed back to pharmacy.

## 2016-11-11 DIAGNOSIS — F418 Other specified anxiety disorders: Secondary | ICD-10-CM | POA: Diagnosis not present

## 2016-11-18 DIAGNOSIS — F418 Other specified anxiety disorders: Secondary | ICD-10-CM | POA: Diagnosis not present

## 2016-11-19 DIAGNOSIS — F418 Other specified anxiety disorders: Secondary | ICD-10-CM | POA: Diagnosis not present

## 2016-11-19 DIAGNOSIS — M797 Fibromyalgia: Secondary | ICD-10-CM | POA: Diagnosis not present

## 2016-12-02 DIAGNOSIS — F418 Other specified anxiety disorders: Secondary | ICD-10-CM | POA: Diagnosis not present

## 2016-12-09 DIAGNOSIS — F418 Other specified anxiety disorders: Secondary | ICD-10-CM | POA: Diagnosis not present

## 2016-12-16 DIAGNOSIS — F418 Other specified anxiety disorders: Secondary | ICD-10-CM | POA: Diagnosis not present

## 2016-12-23 DIAGNOSIS — G894 Chronic pain syndrome: Secondary | ICD-10-CM | POA: Diagnosis not present

## 2016-12-23 DIAGNOSIS — Z79891 Long term (current) use of opiate analgesic: Secondary | ICD-10-CM | POA: Diagnosis not present

## 2016-12-23 DIAGNOSIS — M5030 Other cervical disc degeneration, unspecified cervical region: Secondary | ICD-10-CM | POA: Diagnosis not present

## 2016-12-29 DIAGNOSIS — F418 Other specified anxiety disorders: Secondary | ICD-10-CM | POA: Diagnosis not present

## 2017-01-29 ENCOUNTER — Other Ambulatory Visit: Payer: Self-pay | Admitting: Family Medicine

## 2017-01-30 DIAGNOSIS — F419 Anxiety disorder, unspecified: Secondary | ICD-10-CM | POA: Diagnosis not present

## 2017-01-30 DIAGNOSIS — E559 Vitamin D deficiency, unspecified: Secondary | ICD-10-CM | POA: Diagnosis not present

## 2017-01-30 DIAGNOSIS — E039 Hypothyroidism, unspecified: Secondary | ICD-10-CM | POA: Diagnosis not present

## 2017-01-30 DIAGNOSIS — M797 Fibromyalgia: Secondary | ICD-10-CM | POA: Diagnosis not present

## 2017-01-30 DIAGNOSIS — R5383 Other fatigue: Secondary | ICD-10-CM | POA: Diagnosis not present

## 2017-01-30 DIAGNOSIS — N959 Unspecified menopausal and perimenopausal disorder: Secondary | ICD-10-CM | POA: Diagnosis not present

## 2017-01-30 NOTE — Telephone Encounter (Signed)
Call in #30 with 5 rf 

## 2017-02-03 ENCOUNTER — Other Ambulatory Visit: Payer: Self-pay | Admitting: Family Medicine

## 2017-03-03 ENCOUNTER — Encounter: Payer: Self-pay | Admitting: Family Medicine

## 2017-03-03 ENCOUNTER — Ambulatory Visit (INDEPENDENT_AMBULATORY_CARE_PROVIDER_SITE_OTHER): Payer: BLUE CROSS/BLUE SHIELD | Admitting: Family Medicine

## 2017-03-03 VITALS — BP 138/90 | HR 92 | Temp 98.3°F | Wt 147.6 lb

## 2017-03-03 DIAGNOSIS — B9689 Other specified bacterial agents as the cause of diseases classified elsewhere: Secondary | ICD-10-CM | POA: Diagnosis not present

## 2017-03-03 DIAGNOSIS — J329 Chronic sinusitis, unspecified: Secondary | ICD-10-CM

## 2017-03-03 DIAGNOSIS — H9201 Otalgia, right ear: Secondary | ICD-10-CM | POA: Diagnosis not present

## 2017-03-03 MED ORDER — AMOXICILLIN-POT CLAVULANATE 875-125 MG PO TABS: 1.0000 | ORAL_TABLET | Freq: Two times a day (BID) | ORAL | 0 refills | Status: AC

## 2017-03-03 NOTE — Patient Instructions (Addendum)
Treat for sinus infection with augmentin (take this with food). This would also cover ear infection and allow us some time to get you into ear nose and throat. I am hopeful we can get you in within a week or so- the reason I am sending you is to evaluate better if there is potential rupture of your tympanic membrane (ear drum)

## 2017-03-03 NOTE — Progress Notes (Signed)
Subjective:  Margaret [name] is a 46 y.o. year old very pleasant female patient who presents for/with See problem oriented charting ROS- no fever, chills, nausea, vomiting. Intense right ear pain after a pop last night   Past Medical History-  Patient Active Problem List   Diagnosis Date Noted  . Insomnia 10/31/2016  . Chest pain 04/09/2015  . Hyperlipidemia 11/23/2014  . Hypothyroidism 11/23/2014  . Thrush 02/02/2013  . Arthritis, multiple joint involvement 11/09/2012  . Anxiety state 11/12/2010  . Depression 10/24/2009  . HYPERTENSION 10/23/2009  . Allergic rhinitis 10/23/2009  . GERD 10/23/2009  . Fibromyalgia 10/23/2009  . Headache(784.0) 10/23/2009  . URINARY INCONTINENCE 10/23/2009  . HYPERGLYCEMIA 10/23/2009    Medications- reviewed and updated Current Outpatient Prescriptions  Medication Sig Dispense Refill  . HYDROcodone-acetaminophen (NORCO) 10-325 MG per tablet Take 1 tablet by mouth 4 (four) times daily as needed.      . hydrocortisone-pramoxine (ANALPRAM HC) 2.5-1 % rectal cream Place 1 application rectally 3 (three) times daily. 30 g 2  . levothyroxine (SYNTHROID, LEVOTHROID) 75 MCG tablet TAKE 1 TABLET DAILY. 90 tablet 3  . lisinopril (PRINIVIL,ZESTRIL) 10 MG tablet TAKE 1 TABLET DAILY. 90 tablet 3  . magnesium hydroxide (MILK OF MAGNESIA) 400 MG/5ML suspension Take by mouth as needed.      . meloxicam (MOBIC) 15 MG tablet TAKE 1 TABLET ONCE DAILY. 90 tablet 3  . Methylnaltrexone Bromide (RELISTOR) 8 MG/0.4ML SOLN Inject into the skin as needed.    . NON FORMULARY Probiotics  Once daily    . omeprazole (PRILOSEC OTC) 20 MG tablet Take 20 mg by mouth daily.     . predniSONE (DELTASONE) 10 MG tablet TAKE 1 TABLET EACH DAY. as needed 90 tablet 3  . TRINTELLIX 10 MG TABS TAKE 1 TABLET DAILY. 30 tablet 5  . zolpidem (AMBIEN CR) 6.25 MG CR tablet TAKE 1 TABLET AT BEDTIME AS NEEDED. 30 tablet 5  . amoxicillin-clavulanate (AUGMENTIN) 875-125 MG tablet Take 1 tablet by  mouth 2 (two) times daily. 14 tablet 0   No current facility-administered medications for this visit.     Objective: BP 138/90 (BP Location: Left Arm, Patient Position: Sitting, Cuff Size: Normal)   Pulse 92   Temp 98.3 F (36.8 C) (Oral)   Wt 147 lb 9.6 oz (67 kg)   SpO2 99%   BMI 27.89 kg/m  Gen: NAD, resting comfortably Draining yellow fluid from right ear. TM obscurred by cerumen. TM normal on the left and minimal cerumen. Nares erythematous and tender maxillary sinuses CV: RRR no murmurs rubs or gallops Lungs: CTAB no crackles, wheeze, rhonchi Ext: no edema Skin: warm, dry  Assessment/Plan:  Bacterial sinusitis - Plan: Ambulatory referral to ENT  Right ear pain - Plan: Ambulatory referral to ENT S: sinus congestion, cough, nasal discharge for [redacted] weeks along with sinus pressure. Has had to fly some and felt increased pressure/popping in ears. Then last night felt sudden increase in pain in right ear after a loud pop. Also has noted some reduced hearing during this time. Tried aleve last night and one of her hydrocodone which she has for fibromyalgia. Denies fever.  A/P: Cerumen occluding membrane. 2 weeks of sinus symptoms- will cover for sinusitis with augmentin. With loud pop, decreased hearing- will refer to ENT to evaluate for ruptured TM.   Patient Instructions  Treat for sinus infection with augmentin (take this with food). This would also cover ear infection and allow us some time to get you  into ear nose and throat. I am hopeful we can get you in within a week or so- the reason I am sending you is to evaluate better if there is potential rupture of your tympanic membrane (ear drum)  Orders Placed This Encounter  Procedures  . Ambulatory referral to ENT    Referral Priority:   Routine    Referral Type:   Consultation    Referral Reason:   Specialty Services Required    Requested Specialty:   Otolaryngology    Number of Visits Requested:   1   Meds ordered this  encounter  Medications  . amoxicillin-clavulanate (AUGMENTIN) 875-125 MG tablet    Sig: Take 1 tablet by mouth 2 (two) times daily.    Dispense:  14 tablet    Refill:  0   Return precautions advised.  Tana Conch, MD

## 2017-03-05 DIAGNOSIS — H66003 Acute suppurative otitis media without spontaneous rupture of ear drum, bilateral: Secondary | ICD-10-CM | POA: Diagnosis not present

## 2017-03-12 ENCOUNTER — Encounter: Payer: Self-pay | Admitting: Family Medicine

## 2017-03-16 DIAGNOSIS — E039 Hypothyroidism, unspecified: Secondary | ICD-10-CM | POA: Diagnosis not present

## 2017-03-16 DIAGNOSIS — F419 Anxiety disorder, unspecified: Secondary | ICD-10-CM | POA: Diagnosis not present

## 2017-03-16 DIAGNOSIS — R5383 Other fatigue: Secondary | ICD-10-CM | POA: Diagnosis not present

## 2017-03-16 DIAGNOSIS — M797 Fibromyalgia: Secondary | ICD-10-CM | POA: Diagnosis not present

## 2017-03-16 DIAGNOSIS — E559 Vitamin D deficiency, unspecified: Secondary | ICD-10-CM | POA: Diagnosis not present

## 2017-03-24 DIAGNOSIS — M5030 Other cervical disc degeneration, unspecified cervical region: Secondary | ICD-10-CM | POA: Diagnosis not present

## 2017-03-24 DIAGNOSIS — Z79891 Long term (current) use of opiate analgesic: Secondary | ICD-10-CM | POA: Diagnosis not present

## 2017-03-24 DIAGNOSIS — G894 Chronic pain syndrome: Secondary | ICD-10-CM | POA: Diagnosis not present

## 2017-03-24 DIAGNOSIS — Z5181 Encounter for therapeutic drug level monitoring: Secondary | ICD-10-CM | POA: Diagnosis not present

## 2017-03-24 DIAGNOSIS — Z79899 Other long term (current) drug therapy: Secondary | ICD-10-CM | POA: Diagnosis not present

## 2017-03-30 DIAGNOSIS — H6993 Unspecified Eustachian tube disorder, bilateral: Secondary | ICD-10-CM | POA: Diagnosis not present

## 2017-04-21 DIAGNOSIS — F419 Anxiety disorder, unspecified: Secondary | ICD-10-CM | POA: Diagnosis not present

## 2017-04-21 DIAGNOSIS — M797 Fibromyalgia: Secondary | ICD-10-CM | POA: Diagnosis not present

## 2017-04-21 DIAGNOSIS — E039 Hypothyroidism, unspecified: Secondary | ICD-10-CM | POA: Diagnosis not present

## 2017-04-21 DIAGNOSIS — N959 Unspecified menopausal and perimenopausal disorder: Secondary | ICD-10-CM | POA: Diagnosis not present

## 2017-04-21 DIAGNOSIS — R5383 Other fatigue: Secondary | ICD-10-CM | POA: Diagnosis not present

## 2017-05-20 DIAGNOSIS — E039 Hypothyroidism, unspecified: Secondary | ICD-10-CM | POA: Diagnosis not present

## 2017-05-20 DIAGNOSIS — F419 Anxiety disorder, unspecified: Secondary | ICD-10-CM | POA: Diagnosis not present

## 2017-05-20 DIAGNOSIS — N959 Unspecified menopausal and perimenopausal disorder: Secondary | ICD-10-CM | POA: Diagnosis not present

## 2017-05-20 DIAGNOSIS — E559 Vitamin D deficiency, unspecified: Secondary | ICD-10-CM | POA: Diagnosis not present

## 2017-06-09 DIAGNOSIS — E039 Hypothyroidism, unspecified: Secondary | ICD-10-CM | POA: Diagnosis not present

## 2017-06-09 DIAGNOSIS — R5383 Other fatigue: Secondary | ICD-10-CM | POA: Diagnosis not present

## 2017-06-09 DIAGNOSIS — G894 Chronic pain syndrome: Secondary | ICD-10-CM | POA: Diagnosis not present

## 2017-06-09 DIAGNOSIS — M5030 Other cervical disc degeneration, unspecified cervical region: Secondary | ICD-10-CM | POA: Diagnosis not present

## 2017-06-09 DIAGNOSIS — M797 Fibromyalgia: Secondary | ICD-10-CM | POA: Diagnosis not present

## 2017-06-09 DIAGNOSIS — Z79891 Long term (current) use of opiate analgesic: Secondary | ICD-10-CM | POA: Diagnosis not present

## 2017-07-27 DIAGNOSIS — E039 Hypothyroidism, unspecified: Secondary | ICD-10-CM | POA: Diagnosis not present

## 2017-07-27 DIAGNOSIS — R7989 Other specified abnormal findings of blood chemistry: Secondary | ICD-10-CM | POA: Diagnosis not present

## 2017-07-27 DIAGNOSIS — F419 Anxiety disorder, unspecified: Secondary | ICD-10-CM | POA: Diagnosis not present

## 2017-08-04 ENCOUNTER — Other Ambulatory Visit: Payer: Self-pay | Admitting: Family Medicine

## 2017-08-04 NOTE — Telephone Encounter (Signed)
Last OV 10/31/2016   Last refilled 01/30/2017 disp 30 with 5 refills   Sent to PCP for approval

## 2017-08-04 NOTE — Telephone Encounter (Signed)
Call in #30 with 5 rf 

## 2017-08-11 ENCOUNTER — Encounter: Payer: Self-pay | Admitting: Family Medicine

## 2017-08-11 ENCOUNTER — Ambulatory Visit (INDEPENDENT_AMBULATORY_CARE_PROVIDER_SITE_OTHER): Payer: BLUE CROSS/BLUE SHIELD | Admitting: Family Medicine

## 2017-08-11 VITALS — BP 110/70 | HR 130 | Temp 98.2°F | Ht 60.75 in | Wt 152.6 lb

## 2017-08-11 DIAGNOSIS — Z0000 Encounter for general adult medical examination without abnormal findings: Secondary | ICD-10-CM | POA: Diagnosis not present

## 2017-08-11 MED ORDER — METOPROLOL SUCCINATE ER 50 MG PO TB24: 50.0000 mg | ORAL_TABLET | Freq: Every day | ORAL | 3 refills | Status: DC

## 2017-08-11 MED ORDER — GABAPENTIN 600 MG PO TABS: 600.0000 mg | ORAL_TABLET | Freq: Every day | ORAL | 0 refills | Status: DC

## 2017-08-11 MED ORDER — TESTOSTERONE 12.5 MG/ACT (1%) TD GEL: 2.0000 | Freq: Every day | TRANSDERMAL | 0 refills | Status: DC

## 2017-08-11 MED ORDER — NALOXEGOL OXALATE 25 MG PO TABS: 25.0000 mg | ORAL_TABLET | Freq: Every day | ORAL | 0 refills | Status: AC

## 2017-08-11 MED ORDER — ZOLPIDEM TARTRATE ER 12.5 MG PO TBCR: 12.5000 mg | EXTENDED_RELEASE_TABLET | Freq: Every evening | ORAL | 1 refills | Status: DC | PRN

## 2017-08-11 MED ORDER — THYROID 60 MG PO TABS: 90.0000 mg | ORAL_TABLET | Freq: Every day | ORAL | 0 refills | Status: DC

## 2017-08-11 NOTE — Progress Notes (Signed)
   Subjective:    Patient ID: Margaret [name], female    DOB: 07-28-1970, 47 y.o.   MRN: [id number]  HPI Here for a well exam. She is doing well in general. She has been off Methadone since last March per her pain management doctor. She has been seeing Piedad ClimesGina Davis NP [facility] [place] and she diagnosed her with hemachromatosis. Labs in September showed a serum iron of around 250 and her ferritin was over 400. They are treating this with phlebotomies every 60 days. She also switched her from Levothyroxine to [place] thyroid daily. Her BP has been well controlled on Lisinopril but her resting heart rate remains high from 110 to 130.    Review of Systems  Constitutional: Negative.   HENT: Negative.   Eyes: Negative.   Respiratory: Negative.   Cardiovascular: Positive for palpitations. Negative for chest pain and leg swelling.  Gastrointestinal: Negative.   Genitourinary: Negative for decreased urine volume, difficulty urinating, dyspareunia, dysuria, enuresis, flank pain, frequency, hematuria, pelvic pain and urgency.  Musculoskeletal: Negative.   Skin: Negative.   Neurological: Negative.   Psychiatric/Behavioral: Negative.        Objective:   Physical Exam  Constitutional: She is oriented to person, place, and time. She appears well-developed and well-nourished. No distress.  HENT:  Head: Normocephalic and atraumatic.  Right Ear: External ear normal.  Left Ear: External ear normal.  Nose: Nose normal.  Mouth/Throat: Oropharynx is clear and moist. No oropharyngeal exudate.  Eyes: Conjunctivae and EOM are normal. Pupils are equal, round, and reactive to light. No scleral icterus.  Neck: Normal range of motion. Neck supple. No JVD present. No thyromegaly present.  Cardiovascular: Normal rate, regular rhythm, normal heart sounds and intact distal pulses. Exam reveals no gallop and no friction rub.  No murmur heard. Pulmonary/Chest: Effort normal  and breath sounds normal. No respiratory distress. She has no wheezes. She has no rales. She exhibits no tenderness.  Abdominal: Soft. Bowel sounds are normal. She exhibits no distension and no mass. There is no tenderness. There is no rebound and no guarding.  Musculoskeletal: Normal range of motion. She exhibits no edema or tenderness.  Lymphadenopathy:    She has no cervical adenopathy.  Neurological: She is alert and oriented to person, place, and time. She has normal reflexes. No cranial nerve deficit. She exhibits normal muscle tone. Coordination normal.  Skin: Skin is warm and dry. No rash noted. No erythema.  Psychiatric: She has a normal mood and affect. Her behavior is normal. Judgment and thought content normal.          Assessment & Plan:  Well exam. We discussed diet and exercise. She will set up fasting labs soon. For the HTN we will stop Lisinopril and start on Metoprolol succinate 50 mg daily. Hopefull this will help to reduce the heart rate as well.  Gershon CraneStephen Fry, MD

## 2017-08-14 ENCOUNTER — Other Ambulatory Visit: Payer: BLUE CROSS/BLUE SHIELD

## 2017-08-20 ENCOUNTER — Other Ambulatory Visit: Payer: Self-pay | Admitting: Family Medicine

## 2017-10-06 DIAGNOSIS — Z01419 Encounter for gynecological examination (general) (routine) without abnormal findings: Secondary | ICD-10-CM | POA: Diagnosis not present

## 2017-10-06 DIAGNOSIS — Z6829 Body mass index (BMI) 29.0-29.9, adult: Secondary | ICD-10-CM | POA: Diagnosis not present

## 2017-10-06 DIAGNOSIS — Z1231 Encounter for screening mammogram for malignant neoplasm of breast: Secondary | ICD-10-CM | POA: Diagnosis not present

## 2017-10-06 DIAGNOSIS — Z1389 Encounter for screening for other disorder: Secondary | ICD-10-CM | POA: Diagnosis not present

## 2017-10-06 DIAGNOSIS — Z130 Encounter for screening for diseases of the blood and blood-forming organs and certain disorders involving the immune mechanism: Secondary | ICD-10-CM | POA: Diagnosis not present

## 2017-10-21 DIAGNOSIS — R5383 Other fatigue: Secondary | ICD-10-CM | POA: Diagnosis not present

## 2017-10-26 ENCOUNTER — Encounter: Payer: Self-pay | Admitting: Family Medicine

## 2017-10-27 ENCOUNTER — Other Ambulatory Visit: Payer: Self-pay

## 2017-10-27 MED ORDER — ZOLPIDEM TARTRATE 10 MG PO TABS: 10.0000 mg | ORAL_TABLET | Freq: Every day | ORAL | 5 refills | Status: DC

## 2017-10-27 NOTE — Telephone Encounter (Signed)
Noted. Call in Zolpidem 10 mg to take qhs, #30 with 5 rf

## 2017-10-27 NOTE — Telephone Encounter (Signed)
Noted. Call in Zolpidem 10 mg to take qhs, #30 with 5 rf  rx has been called in and we removed zolpidem, 12.5 MG dose

## 2017-11-26 DIAGNOSIS — R5383 Other fatigue: Secondary | ICD-10-CM | POA: Diagnosis not present

## 2017-11-26 DIAGNOSIS — E559 Vitamin D deficiency, unspecified: Secondary | ICD-10-CM | POA: Diagnosis not present

## 2017-11-26 DIAGNOSIS — M797 Fibromyalgia: Secondary | ICD-10-CM | POA: Diagnosis not present

## 2017-11-26 DIAGNOSIS — F419 Anxiety disorder, unspecified: Secondary | ICD-10-CM | POA: Diagnosis not present

## 2017-11-26 DIAGNOSIS — E039 Hypothyroidism, unspecified: Secondary | ICD-10-CM | POA: Diagnosis not present

## 2017-12-03 DIAGNOSIS — E039 Hypothyroidism, unspecified: Secondary | ICD-10-CM | POA: Diagnosis not present

## 2017-12-03 DIAGNOSIS — F419 Anxiety disorder, unspecified: Secondary | ICD-10-CM | POA: Diagnosis not present

## 2017-12-03 DIAGNOSIS — M797 Fibromyalgia: Secondary | ICD-10-CM | POA: Diagnosis not present

## 2017-12-03 DIAGNOSIS — R5383 Other fatigue: Secondary | ICD-10-CM | POA: Diagnosis not present

## 2017-12-17 ENCOUNTER — Other Ambulatory Visit: Payer: Self-pay | Admitting: Family Medicine

## 2017-12-18 NOTE — Telephone Encounter (Signed)
Last OV 08/11/2017   Last refilled 09/02/2016 disp 90 with 3 refills   Sent to PCP for approval

## 2018-01-04 DIAGNOSIS — R7989 Other specified abnormal findings of blood chemistry: Secondary | ICD-10-CM | POA: Diagnosis not present

## 2018-01-04 DIAGNOSIS — E039 Hypothyroidism, unspecified: Secondary | ICD-10-CM | POA: Diagnosis not present

## 2018-01-12 DIAGNOSIS — G894 Chronic pain syndrome: Secondary | ICD-10-CM | POA: Diagnosis not present

## 2018-01-12 DIAGNOSIS — Z79891 Long term (current) use of opiate analgesic: Secondary | ICD-10-CM | POA: Diagnosis not present

## 2018-01-12 DIAGNOSIS — M5030 Other cervical disc degeneration, unspecified cervical region: Secondary | ICD-10-CM | POA: Diagnosis not present

## 2018-04-15 ENCOUNTER — Other Ambulatory Visit: Payer: Self-pay | Admitting: Family Medicine

## 2018-04-15 DIAGNOSIS — M5030 Other cervical disc degeneration, unspecified cervical region: Secondary | ICD-10-CM | POA: Diagnosis not present

## 2018-04-15 DIAGNOSIS — Z79899 Other long term (current) drug therapy: Secondary | ICD-10-CM | POA: Diagnosis not present

## 2018-04-15 DIAGNOSIS — M797 Fibromyalgia: Secondary | ICD-10-CM | POA: Diagnosis not present

## 2018-04-15 DIAGNOSIS — Z79891 Long term (current) use of opiate analgesic: Secondary | ICD-10-CM | POA: Diagnosis not present

## 2018-04-15 DIAGNOSIS — Z5181 Encounter for therapeutic drug level monitoring: Secondary | ICD-10-CM | POA: Diagnosis not present

## 2018-04-19 ENCOUNTER — Encounter: Payer: Self-pay | Admitting: Family Medicine

## 2018-04-20 MED ORDER — ZOLPIDEM TARTRATE 10 MG PO TABS: 10.0000 mg | ORAL_TABLET | Freq: Every day | ORAL | 5 refills | Status: DC

## 2018-05-28 NOTE — Telephone Encounter (Signed)
Refilled was phoned in by Dr. Clent RidgesFry on 04/20/18, #30/5

## 2018-06-02 ENCOUNTER — Encounter: Payer: Self-pay | Admitting: Family Medicine

## 2018-06-02 DIAGNOSIS — R5383 Other fatigue: Secondary | ICD-10-CM | POA: Diagnosis not present

## 2018-06-02 DIAGNOSIS — E039 Hypothyroidism, unspecified: Secondary | ICD-10-CM | POA: Diagnosis not present

## 2018-06-02 DIAGNOSIS — N951 Menopausal and female climacteric states: Secondary | ICD-10-CM | POA: Diagnosis not present

## 2018-06-02 DIAGNOSIS — E559 Vitamin D deficiency, unspecified: Secondary | ICD-10-CM | POA: Diagnosis not present

## 2018-08-09 ENCOUNTER — Other Ambulatory Visit: Payer: Self-pay | Admitting: Family Medicine

## 2018-08-22 ENCOUNTER — Other Ambulatory Visit: Payer: Self-pay | Admitting: Family Medicine

## 2018-08-24 NOTE — Telephone Encounter (Signed)
Dr. Fry please advise on refill. Thanks  

## 2018-10-08 ENCOUNTER — Other Ambulatory Visit: Payer: Self-pay | Admitting: Family Medicine

## 2018-10-08 ENCOUNTER — Telehealth: Payer: Self-pay | Admitting: Family Medicine

## 2018-10-08 MED ORDER — ZOLPIDEM TARTRATE 10 MG PO TABS: 10.0000 mg | ORAL_TABLET | Freq: Every day | ORAL | 0 refills | Status: DC

## 2018-10-08 NOTE — Telephone Encounter (Signed)
Called and spoke with pt and she is aware of rx that has been called to the pharmacy for 30 days.  appt scheduled for Monday afternoon.

## 2018-10-08 NOTE — Telephone Encounter (Signed)
Patient left a voicemail requesting a call back in order for her to make an appointment and get her medication refilled.

## 2018-10-08 NOTE — Addendum Note (Signed)
Addended by: Marcellus [name] on: 10/08/2018 03:27 PM   Modules accepted: Orders

## 2018-10-11 ENCOUNTER — Other Ambulatory Visit: Payer: Self-pay

## 2018-10-11 ENCOUNTER — Encounter: Payer: Self-pay | Admitting: Family Medicine

## 2018-10-11 ENCOUNTER — Ambulatory Visit (INDEPENDENT_AMBULATORY_CARE_PROVIDER_SITE_OTHER): Payer: BLUE CROSS/BLUE SHIELD | Admitting: Family Medicine

## 2018-10-11 DIAGNOSIS — G4700 Insomnia, unspecified: Secondary | ICD-10-CM

## 2018-10-11 DIAGNOSIS — F411 Generalized anxiety disorder: Secondary | ICD-10-CM

## 2018-10-11 DIAGNOSIS — I10 Essential (primary) hypertension: Secondary | ICD-10-CM | POA: Diagnosis not present

## 2018-10-11 DIAGNOSIS — F324 Major depressive disorder, single episode, in partial remission: Secondary | ICD-10-CM | POA: Diagnosis not present

## 2018-10-11 MED ORDER — ZOLPIDEM TARTRATE 10 MG PO TABS: 10.0000 mg | ORAL_TABLET | Freq: Every day | ORAL | 1 refills | Status: DC

## 2018-10-11 MED ORDER — CITALOPRAM HYDROBROMIDE 20 MG PO TABS: 20.0000 mg | ORAL_TABLET | Freq: Every day | ORAL | 3 refills | Status: DC

## 2018-10-11 MED ORDER — METOPROLOL SUCCINATE ER 50 MG PO TB24: ORAL_TABLET | ORAL | 3 refills | Status: DC

## 2018-10-11 NOTE — Progress Notes (Signed)
   Subjective:    Patient ID: Margaret [name], female    DOB: 26-Feb-1971, 48 y.o.   MRN: [id number]  HPI Virtual Visit via Telephone Note  I connected with the patient on 10/11/18 at  2:00 PM EDT by telephone and verified that I am speaking with the correct person using two identifiers. We attempted to meet via [email or web address] but we had technical difficulties.    I discussed the limitations, risks, security and privacy concerns of performing an evaluation and management service by telephone and the availability of in person appointments. I also discussed with the patient that there may be a patient responsible charge related to this service. The patient expressed understanding and agreed to proceed.  Location patient: home Location provider: work or home office Participants present for the call: patient, provider Patient did not have a visit in the prior 7 days to address this/these issue(s).   History of Present Illness: She has been doing well in general. Her BP is stable. She has been sleeping well. Her depression and anxiety have been stable on Trintillex but she is concerned about the high cost of buying this every month. She wants to go back on something cheaper like Celexa.    Observations/Objective: Patient sounds cheerful and well on the phone. I do not appreciate any SOB. Speech and thought processing are grossly intact. Patient reported vitals:  Assessment and Plan: For the HTN, we refilled Metoprolol succinate. For sleep we refilled Ambien. For depression and anxiety, we will change her back to Celexa 20 mg daily. Recheck prn.  Gershon Crane, MD   Follow Up Instructions:     [id number] 5-10 [id number] 11-20 [id number] 21-30 I did not refer this patient for an OV in the next 24 hours for this/these issue(s).  I discussed the assessment and treatment plan with the patient. The patient was provided an opportunity to ask questions and all were answered. The patient agreed with the plan and  demonstrated an understanding of the instructions.   The patient was advised to call back or seek an in-person evaluation if the symptoms worsen or if the condition fails to improve as anticipated.  I provided 17 minutes of non-face-to-face time during this encounter.   Gershon Crane, MD    Review of Systems     Objective:   Physical Exam        Assessment & Plan:

## 2018-10-14 DIAGNOSIS — Z79891 Long term (current) use of opiate analgesic: Secondary | ICD-10-CM | POA: Diagnosis not present

## 2018-10-14 DIAGNOSIS — M5030 Other cervical disc degeneration, unspecified cervical region: Secondary | ICD-10-CM | POA: Diagnosis not present

## 2018-10-14 DIAGNOSIS — G894 Chronic pain syndrome: Secondary | ICD-10-CM | POA: Diagnosis not present

## 2018-11-11 DIAGNOSIS — M5030 Other cervical disc degeneration, unspecified cervical region: Secondary | ICD-10-CM | POA: Diagnosis not present

## 2018-11-11 DIAGNOSIS — Z79891 Long term (current) use of opiate analgesic: Secondary | ICD-10-CM | POA: Diagnosis not present

## 2018-11-11 DIAGNOSIS — G894 Chronic pain syndrome: Secondary | ICD-10-CM | POA: Diagnosis not present

## 2018-11-19 DIAGNOSIS — N959 Unspecified menopausal and perimenopausal disorder: Secondary | ICD-10-CM | POA: Diagnosis not present

## 2018-11-19 DIAGNOSIS — M797 Fibromyalgia: Secondary | ICD-10-CM | POA: Diagnosis not present

## 2018-11-19 DIAGNOSIS — R5383 Other fatigue: Secondary | ICD-10-CM | POA: Diagnosis not present

## 2018-11-19 DIAGNOSIS — E559 Vitamin D deficiency, unspecified: Secondary | ICD-10-CM | POA: Diagnosis not present

## 2018-11-19 DIAGNOSIS — E039 Hypothyroidism, unspecified: Secondary | ICD-10-CM | POA: Diagnosis not present

## 2018-11-19 DIAGNOSIS — F419 Anxiety disorder, unspecified: Secondary | ICD-10-CM | POA: Diagnosis not present

## 2018-11-22 ENCOUNTER — Encounter: Payer: Self-pay | Admitting: Family Medicine

## 2018-11-22 NOTE — Telephone Encounter (Signed)
Dr. Fry please advise. Thanks  

## 2018-11-23 NOTE — Telephone Encounter (Signed)
Stop the Celexa. Call in Trintellix 10 mg daily, #90 with 3 rf

## 2018-11-24 MED ORDER — VORTIOXETINE HBR 10 MG PO TABS: 10.0000 mg | ORAL_TABLET | Freq: Every day | ORAL | 3 refills | Status: DC

## 2018-12-07 DIAGNOSIS — R5383 Other fatigue: Secondary | ICD-10-CM | POA: Diagnosis not present

## 2018-12-07 DIAGNOSIS — G894 Chronic pain syndrome: Secondary | ICD-10-CM | POA: Diagnosis not present

## 2018-12-07 DIAGNOSIS — Z03818 Encounter for observation for suspected exposure to other biological agents ruled out: Secondary | ICD-10-CM | POA: Diagnosis not present

## 2018-12-07 DIAGNOSIS — Z79891 Long term (current) use of opiate analgesic: Secondary | ICD-10-CM | POA: Diagnosis not present

## 2018-12-07 DIAGNOSIS — E039 Hypothyroidism, unspecified: Secondary | ICD-10-CM | POA: Diagnosis not present

## 2018-12-07 DIAGNOSIS — M47816 Spondylosis without myelopathy or radiculopathy, lumbar region: Secondary | ICD-10-CM | POA: Diagnosis not present

## 2018-12-07 DIAGNOSIS — M5136 Other intervertebral disc degeneration, lumbar region: Secondary | ICD-10-CM | POA: Diagnosis not present

## 2018-12-07 DIAGNOSIS — M797 Fibromyalgia: Secondary | ICD-10-CM | POA: Diagnosis not present

## 2018-12-07 DIAGNOSIS — F419 Anxiety disorder, unspecified: Secondary | ICD-10-CM | POA: Diagnosis not present

## 2019-03-08 DIAGNOSIS — M5136 Other intervertebral disc degeneration, lumbar region: Secondary | ICD-10-CM | POA: Diagnosis not present

## 2019-03-08 DIAGNOSIS — Z5181 Encounter for therapeutic drug level monitoring: Secondary | ICD-10-CM | POA: Diagnosis not present

## 2019-03-08 DIAGNOSIS — M47816 Spondylosis without myelopathy or radiculopathy, lumbar region: Secondary | ICD-10-CM | POA: Diagnosis not present

## 2019-03-08 DIAGNOSIS — G894 Chronic pain syndrome: Secondary | ICD-10-CM | POA: Diagnosis not present

## 2019-03-08 DIAGNOSIS — Z79899 Other long term (current) drug therapy: Secondary | ICD-10-CM | POA: Diagnosis not present

## 2019-03-08 DIAGNOSIS — Z79891 Long term (current) use of opiate analgesic: Secondary | ICD-10-CM | POA: Diagnosis not present

## 2019-04-07 DIAGNOSIS — N951 Menopausal and female climacteric states: Secondary | ICD-10-CM | POA: Diagnosis not present

## 2019-04-07 DIAGNOSIS — Z1231 Encounter for screening mammogram for malignant neoplasm of breast: Secondary | ICD-10-CM | POA: Diagnosis not present

## 2019-04-07 DIAGNOSIS — Z130 Encounter for screening for diseases of the blood and blood-forming organs and certain disorders involving the immune mechanism: Secondary | ICD-10-CM | POA: Diagnosis not present

## 2019-04-07 DIAGNOSIS — Z01419 Encounter for gynecological examination (general) (routine) without abnormal findings: Secondary | ICD-10-CM | POA: Diagnosis not present

## 2019-04-07 DIAGNOSIS — Z1389 Encounter for screening for other disorder: Secondary | ICD-10-CM | POA: Diagnosis not present

## 2019-05-02 ENCOUNTER — Other Ambulatory Visit: Payer: Self-pay | Admitting: Family Medicine

## 2019-05-04 ENCOUNTER — Other Ambulatory Visit: Payer: Self-pay | Admitting: Family Medicine

## 2019-05-04 NOTE — Telephone Encounter (Signed)
Last OV 10/11/18 Last refill 10/11/18 #90/1 Next OV not scheduled  Forwarding to Dr. Sarajane Jews.

## 2019-05-17 DIAGNOSIS — F419 Anxiety disorder, unspecified: Secondary | ICD-10-CM | POA: Diagnosis not present

## 2019-05-17 DIAGNOSIS — E559 Vitamin D deficiency, unspecified: Secondary | ICD-10-CM | POA: Diagnosis not present

## 2019-05-17 DIAGNOSIS — R5383 Other fatigue: Secondary | ICD-10-CM | POA: Diagnosis not present

## 2019-05-17 DIAGNOSIS — E039 Hypothyroidism, unspecified: Secondary | ICD-10-CM | POA: Diagnosis not present

## 2019-05-17 DIAGNOSIS — M797 Fibromyalgia: Secondary | ICD-10-CM | POA: Diagnosis not present

## 2019-06-02 DIAGNOSIS — Z79891 Long term (current) use of opiate analgesic: Secondary | ICD-10-CM | POA: Diagnosis not present

## 2019-06-02 DIAGNOSIS — F419 Anxiety disorder, unspecified: Secondary | ICD-10-CM | POA: Diagnosis not present

## 2019-06-02 DIAGNOSIS — E039 Hypothyroidism, unspecified: Secondary | ICD-10-CM | POA: Diagnosis not present

## 2019-06-02 DIAGNOSIS — R5383 Other fatigue: Secondary | ICD-10-CM | POA: Diagnosis not present

## 2019-06-02 DIAGNOSIS — M5030 Other cervical disc degeneration, unspecified cervical region: Secondary | ICD-10-CM | POA: Diagnosis not present

## 2019-06-02 DIAGNOSIS — G894 Chronic pain syndrome: Secondary | ICD-10-CM | POA: Diagnosis not present

## 2019-07-21 DIAGNOSIS — M5136 Other intervertebral disc degeneration, lumbar region: Secondary | ICD-10-CM | POA: Diagnosis not present

## 2019-07-21 DIAGNOSIS — Z79891 Long term (current) use of opiate analgesic: Secondary | ICD-10-CM | POA: Diagnosis not present

## 2019-07-21 DIAGNOSIS — G894 Chronic pain syndrome: Secondary | ICD-10-CM | POA: Diagnosis not present

## 2019-07-21 DIAGNOSIS — M47816 Spondylosis without myelopathy or radiculopathy, lumbar region: Secondary | ICD-10-CM | POA: Diagnosis not present

## 2019-08-18 DIAGNOSIS — M47816 Spondylosis without myelopathy or radiculopathy, lumbar region: Secondary | ICD-10-CM | POA: Diagnosis not present

## 2019-08-18 DIAGNOSIS — Z79891 Long term (current) use of opiate analgesic: Secondary | ICD-10-CM | POA: Diagnosis not present

## 2019-08-18 DIAGNOSIS — G894 Chronic pain syndrome: Secondary | ICD-10-CM | POA: Diagnosis not present

## 2019-08-18 DIAGNOSIS — M5136 Other intervertebral disc degeneration, lumbar region: Secondary | ICD-10-CM | POA: Diagnosis not present

## 2019-09-19 DIAGNOSIS — G894 Chronic pain syndrome: Secondary | ICD-10-CM | POA: Diagnosis not present

## 2019-09-19 DIAGNOSIS — M5136 Other intervertebral disc degeneration, lumbar region: Secondary | ICD-10-CM | POA: Diagnosis not present

## 2019-09-19 DIAGNOSIS — Z79891 Long term (current) use of opiate analgesic: Secondary | ICD-10-CM | POA: Diagnosis not present

## 2019-09-19 DIAGNOSIS — M5030 Other cervical disc degeneration, unspecified cervical region: Secondary | ICD-10-CM | POA: Diagnosis not present

## 2019-10-27 ENCOUNTER — Other Ambulatory Visit: Payer: Self-pay | Admitting: Family Medicine

## 2019-10-28 NOTE — Telephone Encounter (Signed)
Last filled 05/05/2019 Last OV 09/19/2019  Ok to fill?

## 2019-11-04 ENCOUNTER — Other Ambulatory Visit: Payer: Self-pay

## 2019-11-04 ENCOUNTER — Encounter: Payer: Self-pay | Admitting: Family Medicine

## 2019-11-04 ENCOUNTER — Ambulatory Visit (INDEPENDENT_AMBULATORY_CARE_PROVIDER_SITE_OTHER): Payer: BC Managed Care – PPO | Admitting: Family Medicine

## 2019-11-04 VITALS — BP 110/70 | HR 70 | Temp 98.3°F | Wt 164.2 lb

## 2019-11-04 DIAGNOSIS — I10 Essential (primary) hypertension: Secondary | ICD-10-CM | POA: Diagnosis not present

## 2019-11-04 DIAGNOSIS — K219 Gastro-esophageal reflux disease without esophagitis: Secondary | ICD-10-CM | POA: Diagnosis not present

## 2019-11-04 DIAGNOSIS — F324 Major depressive disorder, single episode, in partial remission: Secondary | ICD-10-CM

## 2019-11-04 DIAGNOSIS — G4730 Sleep apnea, unspecified: Secondary | ICD-10-CM

## 2019-11-04 DIAGNOSIS — G4700 Insomnia, unspecified: Secondary | ICD-10-CM

## 2019-11-04 DIAGNOSIS — F411 Generalized anxiety disorder: Secondary | ICD-10-CM

## 2019-11-04 MED ORDER — METOPROLOL SUCCINATE ER 50 MG PO TB24: ORAL_TABLET | ORAL | 3 refills | Status: DC

## 2019-11-04 MED ORDER — FAMOTIDINE 20 MG PO TABS: 40.0000 mg | ORAL_TABLET | Freq: Every day | ORAL | 0 refills | Status: DC

## 2019-11-04 MED ORDER — VORTIOXETINE HBR 20 MG PO TABS: 20.0000 mg | ORAL_TABLET | Freq: Every day | ORAL | 5 refills | Status: DC

## 2019-11-04 MED ORDER — OMEPRAZOLE MAGNESIUM 20 MG PO TBEC: DELAYED_RELEASE_TABLET | ORAL | 0 refills | Status: DC

## 2019-11-04 NOTE — Progress Notes (Signed)
   Subjective:    Patient ID: Margaret [name], female    DOB: 06/18/1971, 49 y.o.   MRN: [id number]  HPI Here for several issues. First for the past year she has felt extremely tired all the time, even though she sleeps at night. Since taking Zolpidem she does not wake up much at night. However she often wakes up with reflux symptoms, like sour fluid coming up into her throat. She often gets choked and has to sit up. This occurs despite taking Omeprazole before going to bed. In addition  She has snored for years and her husband says she often stops breathing for a few seconds. She notes her brother has sleep apnea. Lastly her depression has been a little worse lately, and her anxiety remains high as usual. Her BP is stable.   Review of Systems  Constitutional: Positive for fatigue.  Respiratory: Positive for choking.   Cardiovascular: Negative.   Gastrointestinal: Negative.   Psychiatric/Behavioral: Positive for dysphoric mood and sleep disturbance. The patient is nervous/anxious.        Objective:   Physical Exam Constitutional:      Appearance: Normal appearance.  Cardiovascular:     Rate and Rhythm: Normal rate and regular rhythm.     Pulses: Normal pulses.     Heart sounds: Normal heart sounds.  Pulmonary:     Effort: Pulmonary effort is normal.     Breath sounds: Normal breath sounds.  Neurological:     General: No focal deficit present.     Mental Status: She is alert and oriented to person, place, and time.  Psychiatric:        Mood and Affect: Mood normal.        Thought Content: Thought content normal.        Judgment: Judgment normal.           Assessment & Plan:  For her GERD she will start taking Omeprazole in the mornings and she will take 40 mg of Pepcid in the evenings. She likely has sleep apnea, so we will refer her to Pulmonary for a workup. Her HTN is stable. For the depression and anxiety she will increase the Trintellix to 20 mg daily.  Gershon Crane, MD

## 2019-11-09 ENCOUNTER — Encounter: Payer: Self-pay | Admitting: Family Medicine

## 2019-11-09 NOTE — Telephone Encounter (Signed)
I agree. Stop the Trintellix 20 mg. Instead call in 10 mg to take once daily, #30 with 5 rf.

## 2019-11-10 MED ORDER — VORTIOXETINE HBR 10 MG PO TABS: 10.0000 mg | ORAL_TABLET | Freq: Every day | ORAL | 5 refills | Status: DC

## 2019-11-14 DIAGNOSIS — M797 Fibromyalgia: Secondary | ICD-10-CM | POA: Diagnosis not present

## 2019-11-14 DIAGNOSIS — M5030 Other cervical disc degeneration, unspecified cervical region: Secondary | ICD-10-CM | POA: Diagnosis not present

## 2019-11-14 DIAGNOSIS — G894 Chronic pain syndrome: Secondary | ICD-10-CM | POA: Diagnosis not present

## 2019-11-14 DIAGNOSIS — Z5181 Encounter for therapeutic drug level monitoring: Secondary | ICD-10-CM | POA: Diagnosis not present

## 2019-11-14 DIAGNOSIS — Z79891 Long term (current) use of opiate analgesic: Secondary | ICD-10-CM | POA: Diagnosis not present

## 2019-11-14 DIAGNOSIS — Z79899 Other long term (current) drug therapy: Secondary | ICD-10-CM | POA: Diagnosis not present

## 2019-11-17 ENCOUNTER — Institutional Professional Consult (permissible substitution): Payer: BC Managed Care – PPO | Admitting: Internal Medicine

## 2019-11-23 ENCOUNTER — Encounter: Payer: BC Managed Care – PPO | Admitting: Family Medicine

## 2019-12-06 ENCOUNTER — Encounter: Payer: Self-pay | Admitting: Family Medicine

## 2019-12-06 DIAGNOSIS — R5383 Other fatigue: Secondary | ICD-10-CM | POA: Diagnosis not present

## 2019-12-06 DIAGNOSIS — M797 Fibromyalgia: Secondary | ICD-10-CM | POA: Diagnosis not present

## 2019-12-06 DIAGNOSIS — F419 Anxiety disorder, unspecified: Secondary | ICD-10-CM | POA: Diagnosis not present

## 2019-12-06 DIAGNOSIS — E559 Vitamin D deficiency, unspecified: Secondary | ICD-10-CM | POA: Diagnosis not present

## 2019-12-06 DIAGNOSIS — E039 Hypothyroidism, unspecified: Secondary | ICD-10-CM | POA: Diagnosis not present

## 2019-12-06 DIAGNOSIS — N959 Unspecified menopausal and perimenopausal disorder: Secondary | ICD-10-CM | POA: Diagnosis not present

## 2019-12-07 ENCOUNTER — Encounter: Payer: Self-pay | Admitting: Internal Medicine

## 2019-12-07 ENCOUNTER — Ambulatory Visit: Payer: BC Managed Care – PPO | Admitting: Internal Medicine

## 2019-12-07 ENCOUNTER — Other Ambulatory Visit: Payer: Self-pay

## 2019-12-07 VITALS — BP 110/70 | HR 80 | Temp 97.2°F | Ht 61.0 in | Wt 163.4 lb

## 2019-12-07 DIAGNOSIS — G4700 Insomnia, unspecified: Secondary | ICD-10-CM

## 2019-12-07 DIAGNOSIS — F411 Generalized anxiety disorder: Secondary | ICD-10-CM | POA: Diagnosis not present

## 2019-12-07 DIAGNOSIS — G4733 Obstructive sleep apnea (adult) (pediatric): Secondary | ICD-10-CM

## 2019-12-07 DIAGNOSIS — R0683 Snoring: Secondary | ICD-10-CM

## 2019-12-07 NOTE — Patient Instructions (Signed)
Order- schedule Home Sleep Test    Dx OSA  Please call us about 2 weeks after your sleep test to see if results and recommendations are ready yet. If appropriate, we may be able to start treatment before we see you next.   

## 2019-12-07 NOTE — Assessment & Plan Note (Signed)
Question if she has medically significant OSA. We reviewed basics, discussed treatments, responsibility to drive safely. Plan- sleep study

## 2019-12-07 NOTE — Assessment & Plan Note (Signed)
Hstory of difficulty initiating and maintaining sleep. Taking zolpidem.  Plan- discussed sleep hygiene, meds, caffeine.

## 2019-12-07 NOTE — Progress Notes (Signed)
12/07/19- 72 yoF former smoker referred for sleep evaluation, courtesy of Dr Sarajane Jews. Medical problem list includes HTN, Allergic Rhinitis, GERD, Hypothyroid, Arthritis, Fibromyalgia, Depression, Hyperlipidemia, Insomnia Epworth score- 2 Body weight today- 163 Had 2 Moderna Covax Reports snoring, wakes gasping for air, difficulty regaining sleep, daytime fatigue. Used to fall asleep in daytime, but says her anxiety now limits that.  Zolpidem 1/2 x 10 mg fo sleep. 1 cup coffee.  ENT surgery + tonsils. Denies heart or lung problems. Brother with OSA.   Prior to Admission medications   Medication Sig Start Date End Date Taking? Authorizing Provider  citalopram (CELEXA) 20 MG tablet Take 20 mg by mouth daily. 09/26/19  Yes [provider]  Continuous Blood Gluc Sensor (FREESTYLE LIBRE 14 DAY SENSOR) MISC APPLY SENSOR EVERY 2 WEEKS 10/09/19  Yes [provider]  famotidine (PEPCID) 20 MG tablet Take 2 tablets (40 mg total) by mouth at bedtime. 11/04/19  Yes Laurey Morale, MD  fluticasone Asencion Islam) 50 MCG/ACT nasal spray Place into both nostrils daily.   Yes [provider]  HYDROcodone Bitartrate ER (ZOHYDRO ER) 30 MG C12A Take 1 tablet by mouth 2 (two) times a day.    Yes [provider]  HYDROmorphone (DILAUDID) 4 MG tablet Take by mouth. 12/18/19 01/17/20 Yes [provider]  levothyroxine (SYNTHROID) 25 MCG tablet Take 25 mcg by mouth daily before breakfast.    Yes [provider]  magnesium hydroxide (MILK OF MAGNESIA) 400 MG/5ML suspension Take by mouth as needed.     Yes [provider]  metoprolol succinate (TOPROL-XL) 50 MG 24 hr tablet TAKE 1 TABLET DAILY WITH OR IMMEDIATELY FOLLOWING A MEAL. 11/04/19  Yes Laurey Morale, MD  naloxegol oxalate (MOVANTIK) 25 MG TABS tablet Take 1 tablet (25 mg total) by mouth daily. 08/11/17  Yes Laurey Morale, MD  naloxone [facility]) 4 MG/0.1ML LIQD nasal spray kit Narcan 4 mg/actuation nasal spray 03/08/19   Yes [provider]  omeprazole (PRILOSEC OTC) 20 MG tablet One every morning 11/04/19  Yes Laurey Morale, MD  polyethylene glycol powder (GLYCOLAX/MIRALAX) 17 GM/SCOOP powder Take by mouth.   Yes [provider]  progesterone (PROMETRIUM) 100 MG capsule  09/20/18  Yes [provider]  progesterone (PROMETRIUM) 200 MG capsule Take 200 mg by mouth at bedtime. 11/29/19  Yes [provider]  Testosterone 12.5 MG/ACT (1%) GEL Place 2 Act onto the skin daily. 08/11/17  Yes Laurey Morale, MD  thyroid [facility] THYROID) 60 MG tablet Take 1.5 tablets (90 mg total) by mouth daily before breakfast. Patient taking differently: Take 60 mg by mouth daily before breakfast.  08/11/17  Yes Laurey Morale, MD  vortioxetine HBr (TRINTELLIX) 10 MG TABS tablet Take 1 tablet (10 mg total) by mouth daily. 11/10/19  Yes Laurey Morale, MD  zolpidem (AMBIEN) 10 MG tablet TAKE 1 TABLET BY MOUTH AT BEDTIME AS NEEDED FOR SLEEP AS DIRECTED 10/28/19  Yes Laurey Morale, MD   Past Medical History:  Diagnosis Date  . ALLERGIC RHINITIS 10/23/2009  . Allergy   . Arthritis    sees Dr. Smith Robert at [facility]   . Chronic pain disorder    sees Dr. Eustace Moore at the [facility] in [place], [place] [phone])  . Depression   . DEPRESSION 10/24/2009  . FIBROMYALGIA 10/23/2009  . GERD 10/23/2009  . Headache(784.0) 10/23/2009  . Hemochromatosis    sees Raoul Pitch NP at [place]  in [place]   . HYPERGLYCEMIA 10/23/2009  . HYPERTENSION 10/23/2009  . Thyroid disease    hypothyroidism   . Urinary incontinence    Past Surgical History:  Procedure Laterality Date  . ABDOMINAL HYSTERECTOMY  2000   with ovaries intact  . CERVICAL FUSION  2005   at C5 and C6  . TONSILECTOMY, ADENOIDECTOMY, BILATERAL MYRINGOTOMY AND TUBES     Family History  Problem Relation Age of Onset  . Hypertension Mother   . Heart disease Mother   . Diabetes Mother   . Diabetes  Father   . Kidney disease Father        kidney disease  . Hypertension Brother   . Coronary artery disease Other        relative <50  . Hypertension Paternal Aunt   . Colon polyps Paternal Grandmother   . Colon cancer Neg Hx    Social History   Socioeconomic History  . Marital status: Divorced    Spouse name: Not on file  . Number of children: Not on file  . Years of education: Not on file  . Highest education level: Not on file  Occupational History  . Not on file  Tobacco Use  . Smoking status: Former Smoker    Types: E-cigarettes  . Smokeless tobacco: Never Used  . Tobacco comment: quit 12/2011  Substance and Sexual Activity  . Alcohol use: No    Alcohol/week: 0.0 standard drinks  . Drug use: No  . Sexual activity: Not on file  Other Topics Concern  . Not on file  Social History Narrative  . Not on file   Social Determinants of Health   Financial Resource Strain:   . Difficulty of Paying Living Expenses:   Food Insecurity:   . Worried About Charity fundraiser in the Last Year:   . Arboriculturist in the Last Year:   Transportation Needs:   . Film/video editor (Medical):   Marland Kitchen Lack of Transportation (Non-Medical):   Physical Activity:   . Days of Exercise per Week:   . Minutes of Exercise per Session:   Stress:   . Feeling of Stress :   Social Connections:   . Frequency of Communication with Friends and Family:   . Frequency of Social Gatherings with Friends and Family:   . Attends Religious Services:   . Active Member of Clubs or Organizations:   . Attends Archivist Meetings:   Marland Kitchen Marital Status:   Intimate Partner Violence:   . Fear of Current or Ex-Partner:   . Emotionally Abused:   Marland Kitchen Physically Abused:   . Sexually Abused:    ROS-see HPI   + = positive Constitutional:    weight loss, night sweats, fevers, chills, fatigue, lassitude. HEENT:    headaches, difficulty swallowing, tooth/dental problems, sore throat,       sneezing,  itching, ear ache, nasal congestion, post nasal drip, snoring CV:    chest pain, orthopnea, PND, swelling in lower extremities, anasarca,                                  dizziness, palpitations Resp:   shortness of breath with exertion or at rest.                productive cough,   non-productive cough, coughing up of blood.  change in color of mucus.  wheezing.   Skin:    rash or lesions. GI:  No-   heartburn, indigestion, abdominal pain, nausea, vomiting, diarrhea,                 change in bowel habits, loss of appetite GU: dysuria, change in color of urine, no urgency or frequency.   flank pain. MS:   joint pain, stiffness, decreased range of motion, back pain. Neuro-     nothing unusual Psych:  change in mood or affect.  depression or+ anxiety.   memory loss.  OBJ- Physical Exam General- Alert, Oriented, Affect-appropriate, Distress- none acute Skin- rash-none, lesions- none, excoriation- none Lymphadenopathy- none Head- atraumatic            Eyes- Gross vision intact, PERRLA, conjunctivae and secretions clear            Ears- Hearing, canals-normal            Nose- Clear, no-Septal dev, mucus, polyps, erosion, perforation             Throat- Mallampati II- III , mucosa clear , drainage- none, tonsils- atrophic Neck- flexible , trachea midline, no stridor , thyroid nl, carotid no bruit Chest - symmetrical excursion , unlabored           Heart/CV- RRR , no murmur , no gallop  , no rub, nl s1 s2                           - JVD- none , edema- none, stasis changes- none, varices- none           Lung- clear to P&A, wheeze- none, cough- none , dullness-none, rub- none           Chest wall-  Abd-  Br/ Gen/ Rectal- Not done, not indicated Extrem- cyanosis- none, clubbing, none, atrophy- none, strength- nl Neuro- grossly intact to observation

## 2019-12-07 NOTE — Assessment & Plan Note (Signed)
Pleasant and appropriate at our meeting today, but she referred to her anxiety as impacting her quality of life.

## 2019-12-19 ENCOUNTER — Encounter: Payer: BC Managed Care – PPO | Admitting: Family Medicine

## 2019-12-21 DIAGNOSIS — F329 Major depressive disorder, single episode, unspecified: Secondary | ICD-10-CM | POA: Diagnosis not present

## 2019-12-21 DIAGNOSIS — R5383 Other fatigue: Secondary | ICD-10-CM | POA: Diagnosis not present

## 2019-12-21 DIAGNOSIS — E039 Hypothyroidism, unspecified: Secondary | ICD-10-CM | POA: Diagnosis not present

## 2019-12-23 ENCOUNTER — Encounter: Payer: BC Managed Care – PPO | Admitting: Family Medicine

## 2020-01-06 ENCOUNTER — Encounter: Payer: Self-pay | Admitting: Family Medicine

## 2020-01-06 ENCOUNTER — Ambulatory Visit (INDEPENDENT_AMBULATORY_CARE_PROVIDER_SITE_OTHER): Payer: BC Managed Care – PPO | Admitting: Family Medicine

## 2020-01-06 ENCOUNTER — Other Ambulatory Visit: Payer: Self-pay

## 2020-01-06 VITALS — BP 130/70 | HR 71 | Temp 98.9°F | Wt 159.6 lb

## 2020-01-06 DIAGNOSIS — Z0000 Encounter for general adult medical examination without abnormal findings: Secondary | ICD-10-CM

## 2020-01-06 NOTE — Progress Notes (Signed)
   Subjective:    Patient ID: Margaret [name], female    DOB: [birth year], 49 y.o.   MRN: [id number]  HPI Here for a well exam. She feels well in general, although her anxiety is always an issue. She continues to see Piedad Climes NP at the [facility] to manage her thyroid levels, among other things. Her BP is stable.    Review of Systems  Constitutional: Negative.   HENT: Negative.   Eyes: Negative.   Respiratory: Negative.   Cardiovascular: Negative.   Gastrointestinal: Negative.   Genitourinary: Negative for decreased urine volume, difficulty urinating, dyspareunia, dysuria, enuresis, flank pain, frequency, hematuria, pelvic pain and urgency.  Musculoskeletal: Negative.   Skin: Negative.   Neurological: Negative.   Psychiatric/Behavioral: The patient is nervous/anxious.        Objective:   Physical Exam Constitutional:      General: She is not in acute distress.    Appearance: She is well-developed.  HENT:     Head: Normocephalic and atraumatic.     Right Ear: External ear normal.     Left Ear: External ear normal.     Nose: Nose normal.     Mouth/Throat:     Pharynx: No oropharyngeal exudate.  Eyes:     General: No scleral icterus.    Conjunctiva/sclera: Conjunctivae normal.     Pupils: Pupils are equal, round, and reactive to light.  Neck:     Thyroid: No thyromegaly.     Vascular: No JVD.  Cardiovascular:     Rate and Rhythm: Normal rate and regular rhythm.     Heart sounds: Normal heart sounds. No murmur heard.  No friction rub. No gallop.   Pulmonary:     Effort: Pulmonary effort is normal. No respiratory distress.     Breath sounds: Normal breath sounds. No wheezing or rales.  Chest:     Chest wall: No tenderness.  Abdominal:     General: Bowel sounds are normal. There is no distension.     Palpations: Abdomen is soft. There is no mass.     Tenderness: There is no abdominal tenderness. There is no guarding or rebound.  Musculoskeletal:         General: No tenderness. Normal range of motion.     Cervical back: Normal range of motion and neck supple.  Lymphadenopathy:     Cervical: No cervical adenopathy.  Skin:    General: Skin is warm and dry.     Findings: No erythema or rash.  Neurological:     Mental Status: She is alert and oriented to person, place, and time.     Cranial Nerves: No cranial nerve deficit.     Motor: No abnormal muscle tone.     Coordination: Coordination normal.     Deep Tendon Reflexes: Reflexes are normal and symmetric. Reflexes normal.  Psychiatric:        Behavior: Behavior normal.        Thought Content: Thought content normal.        Judgment: Judgment normal.           Assessment & Plan:  Well exam. She brings a copy of fasting labs with her from 12-12-19, and we reviewed these together. Set up her first colonoscopy.  Gershon Crane, MD

## 2020-01-13 DIAGNOSIS — R5383 Other fatigue: Secondary | ICD-10-CM | POA: Diagnosis not present

## 2020-01-13 DIAGNOSIS — F329 Major depressive disorder, single episode, unspecified: Secondary | ICD-10-CM | POA: Diagnosis not present

## 2020-01-13 DIAGNOSIS — F419 Anxiety disorder, unspecified: Secondary | ICD-10-CM | POA: Diagnosis not present

## 2020-01-13 DIAGNOSIS — E039 Hypothyroidism, unspecified: Secondary | ICD-10-CM | POA: Diagnosis not present

## 2020-01-16 DIAGNOSIS — Z79891 Long term (current) use of opiate analgesic: Secondary | ICD-10-CM | POA: Diagnosis not present

## 2020-01-16 DIAGNOSIS — G894 Chronic pain syndrome: Secondary | ICD-10-CM | POA: Diagnosis not present

## 2020-01-16 DIAGNOSIS — M5030 Other cervical disc degeneration, unspecified cervical region: Secondary | ICD-10-CM | POA: Diagnosis not present

## 2020-01-17 ENCOUNTER — Other Ambulatory Visit: Payer: Self-pay

## 2020-01-17 ENCOUNTER — Ambulatory Visit: Payer: BC Managed Care – PPO

## 2020-01-17 DIAGNOSIS — G4733 Obstructive sleep apnea (adult) (pediatric): Secondary | ICD-10-CM | POA: Diagnosis not present

## 2020-01-31 DIAGNOSIS — G4733 Obstructive sleep apnea (adult) (pediatric): Secondary | ICD-10-CM | POA: Diagnosis not present

## 2020-03-08 ENCOUNTER — Encounter: Payer: Self-pay | Admitting: Gastroenterology

## 2020-03-15 ENCOUNTER — Other Ambulatory Visit: Payer: Self-pay

## 2020-03-15 ENCOUNTER — Ambulatory Visit: Payer: BC Managed Care – PPO | Admitting: Internal Medicine

## 2020-03-15 ENCOUNTER — Encounter: Payer: Self-pay | Admitting: Internal Medicine

## 2020-03-15 VITALS — BP 124/72 | HR 75 | Temp 97.2°F | Ht 61.0 in | Wt 166.2 lb

## 2020-03-15 DIAGNOSIS — G4733 Obstructive sleep apnea (adult) (pediatric): Secondary | ICD-10-CM | POA: Diagnosis not present

## 2020-03-15 DIAGNOSIS — G4700 Insomnia, unspecified: Secondary | ICD-10-CM | POA: Diagnosis not present

## 2020-03-15 NOTE — Patient Instructions (Signed)
Order- new DME new CPAP auto 5-15, mask of choice, humidifier, supplies, AirView/ card  Please call if we can help

## 2020-03-15 NOTE — Assessment & Plan Note (Signed)
Now takes [place]. We can review status as she adjusts to CPAP.

## 2020-03-15 NOTE — Assessment & Plan Note (Signed)
Mild OSA. Treatment directed at symptoms as discussed.  Plan- Start CPAP 5-15

## 2020-03-15 NOTE — Progress Notes (Signed)
12/07/19- 49 yoF former smoker referred for sleep evaluation, courtesy of Dr Clent Ridges. Medical problem list includes HTN, Allergic Rhinitis, GERD, Hypothyroid, Arthritis, Fibromyalgia, Depression, Hyperlipidemia, Insomnia Epworth score- 2 Body weight today- 163 Had 2 Moderna Covax Reports snoring, wakes gasping for air, difficulty regaining sleep, daytime fatigue. Used to fall asleep in daytime, but says her anxiety now limits that.  Zolpidem 1/2 x 10 mg fo sleep. 1 cup coffee.  ENT surgery + tonsils. Denies heart or lung problems. Brother with OSA.   03/15/20- 49 yoF former smoker followed for OSA., complicated by HTN, Allergic Rhinitis, GERD, Hypothyroid, Arthritis, Fibromyalgia, Depression, Hyperlipidemia, Insomnia HST 01/17/20- AHI 6.9/ hr, desaturation to 88%, body weight 159 lbs Covid vax- 2 Moderna Flu vax- Hesitant - hx previous reactions -----FOLLOWS FOR: hst performed 01/17/20.   Body weight today- 166 lbs We reviewed her sleep study and discussed options, including observation and weight loss. 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 Focusing I=on her complaints of snoring, fragmented sleep and daytime tiredness, she wishes to start with CPAP. Taking ambien for insomnia.  ROS-see HPI   + = positive Constitutional:    weight loss, night sweats, fevers, chills, fatigue, lassitude. HEENT:    headaches, difficulty swallowing, tooth/dental problems, sore throat,       sneezing, itching, ear ache, nasal congestion, post nasal drip, snoring CV:    chest pain, orthopnea, PND, swelling in lower extremities, anasarca,                                  dizziness, palpitations Resp:   shortness of breath with exertion or at rest.                productive cough,   non-productive cough, coughing up of blood.              change in color of mucus.  wheezing.   Skin:    rash or lesions. GI:  No-   heartburn, indigestion, abdominal pain, nausea, vomiting, diarrhea,                 change in bowel  habits, loss of appetite GU: dysuria, change in color of urine, no urgency or frequency.   flank pain. MS:   joint pain, stiffness, decreased range of motion, back pain. Neuro-     nothing unusual Psych:  change in mood or affect.  depression or+ anxiety.   memory loss.  OBJ- Physical Exam General- Alert, Oriented, Affect-appropriate, Distress- none acute Skin- rash-none, lesions- none, excoriation- none Lymphadenopathy- none Head- atraumatic            Eyes- Gross vision intact, PERRLA, conjunctivae and secretions clear            Ears- Hearing, canals-normal            Nose- Clear, no-Septal dev, mucus, polyps, erosion, perforation             Throat- Mallampati II- III , mucosa clear , drainage- none, tonsils- atrophic Neck- flexible , trachea midline, no stridor , thyroid nl, carotid no bruit Chest - symmetrical excursion , unlabored           Heart/CV- RRR , no murmur , no gallop  , no rub, nl s1 s2                           - JVD-  none , edema- none, stasis changes- none, varices- none           Lung- clear to P&A, wheeze- none, cough- none , dullness-none, rub- none           Chest wall-  Abd-  Br/ Gen/ Rectal- Not done, not indicated Extrem- cyanosis- none, clubbing, none, atrophy- none, strength- nl Neuro- grossly intact to observation

## 2020-04-10 DIAGNOSIS — G894 Chronic pain syndrome: Secondary | ICD-10-CM | POA: Diagnosis not present

## 2020-04-10 DIAGNOSIS — M5136 Other intervertebral disc degeneration, lumbar region: Secondary | ICD-10-CM | POA: Diagnosis not present

## 2020-04-10 DIAGNOSIS — M5030 Other cervical disc degeneration, unspecified cervical region: Secondary | ICD-10-CM | POA: Diagnosis not present

## 2020-04-10 DIAGNOSIS — Z79891 Long term (current) use of opiate analgesic: Secondary | ICD-10-CM | POA: Diagnosis not present

## 2020-04-20 ENCOUNTER — Other Ambulatory Visit: Payer: Self-pay | Admitting: Family Medicine

## 2020-04-23 DIAGNOSIS — Z1231 Encounter for screening mammogram for malignant neoplasm of breast: Secondary | ICD-10-CM | POA: Diagnosis not present

## 2020-04-23 DIAGNOSIS — Z130 Encounter for screening for diseases of the blood and blood-forming organs and certain disorders involving the immune mechanism: Secondary | ICD-10-CM | POA: Diagnosis not present

## 2020-04-23 DIAGNOSIS — Z01419 Encounter for gynecological examination (general) (routine) without abnormal findings: Secondary | ICD-10-CM | POA: Diagnosis not present

## 2020-04-23 DIAGNOSIS — N951 Menopausal and female climacteric states: Secondary | ICD-10-CM | POA: Diagnosis not present

## 2020-04-23 DIAGNOSIS — Z1389 Encounter for screening for other disorder: Secondary | ICD-10-CM | POA: Diagnosis not present

## 2020-05-03 ENCOUNTER — Encounter: Payer: BC Managed Care – PPO | Admitting: Gastroenterology

## 2020-05-16 DIAGNOSIS — E039 Hypothyroidism, unspecified: Secondary | ICD-10-CM | POA: Diagnosis not present

## 2020-05-16 DIAGNOSIS — F419 Anxiety disorder, unspecified: Secondary | ICD-10-CM | POA: Diagnosis not present

## 2020-05-29 DIAGNOSIS — E039 Hypothyroidism, unspecified: Secondary | ICD-10-CM | POA: Diagnosis not present

## 2020-05-29 DIAGNOSIS — G894 Chronic pain syndrome: Secondary | ICD-10-CM | POA: Diagnosis not present

## 2020-05-29 DIAGNOSIS — R5383 Other fatigue: Secondary | ICD-10-CM | POA: Diagnosis not present

## 2020-05-29 DIAGNOSIS — F32A Depression, unspecified: Secondary | ICD-10-CM | POA: Diagnosis not present

## 2020-05-29 DIAGNOSIS — M5136 Other intervertebral disc degeneration, lumbar region: Secondary | ICD-10-CM | POA: Diagnosis not present

## 2020-05-29 DIAGNOSIS — Z79891 Long term (current) use of opiate analgesic: Secondary | ICD-10-CM | POA: Diagnosis not present

## 2020-05-29 DIAGNOSIS — M47816 Spondylosis without myelopathy or radiculopathy, lumbar region: Secondary | ICD-10-CM | POA: Diagnosis not present

## 2020-06-07 ENCOUNTER — Ambulatory Visit: Payer: BC Managed Care – PPO | Admitting: Internal Medicine

## 2020-06-12 ENCOUNTER — Telehealth (INDEPENDENT_AMBULATORY_CARE_PROVIDER_SITE_OTHER): Payer: BC Managed Care – PPO | Admitting: Family Medicine

## 2020-06-12 ENCOUNTER — Encounter: Payer: Self-pay | Admitting: Family Medicine

## 2020-06-12 VITALS — Wt 162.0 lb

## 2020-06-12 DIAGNOSIS — F411 Generalized anxiety disorder: Secondary | ICD-10-CM | POA: Diagnosis not present

## 2020-06-12 MED ORDER — CLONAZEPAM 0.5 MG PO TABS: 0.5000 mg | ORAL_TABLET | Freq: Two times a day (BID) | ORAL | 0 refills | Status: DC | PRN

## 2020-06-12 MED ORDER — VORTIOXETINE HBR 20 MG PO TABS: 20.0000 mg | ORAL_TABLET | Freq: Every day | ORAL | 0 refills | Status: DC

## 2020-06-12 NOTE — Progress Notes (Signed)
Subjective:    Patient ID: Margaret [name], female    DOB: Jul 29, 1970, 49 y.o.   MRN: [id number]  HPI Virtual Visit via Video Note  I connected with the patient on 06/12/20 at  1:45 PM EST by a video enabled telemedicine application and verified that I am speaking with the correct person using two identifiers.  Location patient: home Location provider:work or home office Persons participating in the virtual visit: patient, provider  I discussed the limitations of evaluation and management by telemedicine and the availability of in person appointments. The patient expressed understanding and agreed to proceed.   HPI: Here for help with some upcoming travel. She gets very anxious when flying, and she will be flying tomorrow to visit family for the holidays. She has had good luck with Clonazepam in the past.   ROS: See pertinent positives and negatives per HPI.  Past Medical History:  Diagnosis Date  . ALLERGIC RHINITIS 10/23/2009  . Allergy   . Arthritis    sees Dr. Smith Robert at [facility]   . Chronic pain disorder    sees Dr. Eustace Moore at the [facility] in [place], [place] [phone])  . Depression   . DEPRESSION 10/24/2009  . FIBROMYALGIA 10/23/2009  . GERD 10/23/2009  . Headache(784.0) 10/23/2009  . Hemochromatosis    sees Raoul Pitch NP at [facility] in [place]   . HYPERGLYCEMIA 10/23/2009  . HYPERTENSION 10/23/2009  . Thyroid disease    hypothyroidism   . Urinary incontinence     Past Surgical History:  Procedure Laterality Date  . ABDOMINAL HYSTERECTOMY  2000   with ovaries intact  . CERVICAL FUSION  2005   at C5 and C6  . TONSILECTOMY, ADENOIDECTOMY, BILATERAL MYRINGOTOMY AND TUBES      Family History  Problem Relation Age of Onset  . Hypertension Mother   . Heart disease Mother   . Diabetes Mother   . Diabetes Father   . Kidney disease Father        kidney disease  . Hypertension Brother   . Coronary artery  disease Other        relative <50  . Hypertension Paternal Aunt   . Colon polyps Paternal Grandmother   . Colon cancer Neg Hx      Current Outpatient Medications:  .  fluticasone (FLONASE) 50 MCG/ACT nasal spray, Place into both nostrils daily., Disp: , Rfl:  .  [START ON 06/21/2020] HYDROmorphone (DILAUDID) 4 MG tablet, Take by mouth every 8 (eight) hours., Disp: , Rfl:  .  levothyroxine (SYNTHROID) 25 MCG tablet, Take 25 mcg by mouth daily before breakfast. , Disp: , Rfl:  .  magnesium hydroxide (MILK OF MAGNESIA) 400 MG/5ML suspension, Take by mouth as needed., Disp: , Rfl:  .  metoprolol succinate (TOPROL-XL) 50 MG 24 hr tablet, TAKE 1 TABLET DAILY WITH OR IMMEDIATELY FOLLOWING A MEAL., Disp: 90 tablet, Rfl: 3 .  naloxegol oxalate (MOVANTIK) 25 MG TABS tablet, Take 1 tablet (25 mg total) by mouth daily., Disp: 30 tablet, Rfl: 0 .  naloxone (NARCAN) 4 MG/0.1ML LIQD nasal spray kit, Narcan 4 mg/actuation nasal spray, Disp: , Rfl:  .  omeprazole (PRILOSEC OTC) 20 MG tablet, One every morning, Disp: 1 tablet, Rfl: 0 .  oxyCODONE ER (XTAMPZA ER) 18 MG C12A, Take by mouth., Disp: , Rfl:  .  progesterone (PROMETRIUM) 200 MG capsule, Take 200 mg by mouth at bedtime., Disp: , Rfl:  .  thyroid (ARMOUR  THYROID) 60 MG tablet, Take 1.5 tablets (90 mg total) by mouth daily before breakfast. (Patient taking differently: Take 60 mg by mouth daily before breakfast.), Disp: 1 tablet, Rfl: 0 .  vortioxetine HBr (TRINTELLIX) 10 MG TABS tablet, Take 1 tablet (10 mg total) by mouth daily. (Patient taking differently: Take 20 mg by mouth daily.), Disp: 30 tablet, Rfl: 5 .  zolpidem (AMBIEN) 10 MG tablet, TAKE 1 TABLET BY MOUTH AT BEDTIME AS NEEDED FOR SLEEP AS DIRECTED, Disp: 90 tablet, Rfl: 1 .  clonazePAM (KLONOPIN) 0.5 MG tablet, Take 1 tablet (0.5 mg total) by mouth 2 (two) times daily as needed for anxiety., Disp: 30 tablet, Rfl: 0 .  Testosterone 12.5 MG/ACT (1%) GEL, Place 2 Act onto the skin daily.  (Patient not taking: Reported on 06/12/2020), Disp: 1 Bottle, Rfl: 0  EXAM:  VITALS per patient if applicable:  GENERAL: alert, oriented, appears well and in no acute distress  HEENT: atraumatic, conjunttiva clear, no obvious abnormalities on inspection of external nose and ears  NECK: normal movements of the head and neck  LUNGS: on inspection no signs of respiratory distress, breathing rate appears normal, no obvious gross SOB, gasping or wheezing  CV: no obvious cyanosis  MS: moves all visible extremities without noticeable abnormality  PSYCH/NEURO: pleasant and cooperative, no obvious depression or anxiety, speech and thought processing grossly intact  ASSESSMENT AND PLAN: Travel anxiety. We will give her #30 Clonazepam to use as needed. Alysia Penna, MD  Discussed the following assessment and plan:  No diagnosis found.     I discussed the assessment and treatment plan with the patient. The patient was provided an opportunity to ask questions and all were answered. The patient agreed with the plan and demonstrated an understanding of the instructions.   The patient was advised to call back or seek an in-person evaluation if the symptoms worsen or if the condition fails to improve as anticipated.     Review of Systems     Objective:   Physical Exam        Assessment & Plan:

## 2020-06-18 DIAGNOSIS — K219 Gastro-esophageal reflux disease without esophagitis: Secondary | ICD-10-CM | POA: Diagnosis not present

## 2020-06-18 DIAGNOSIS — R059 Cough, unspecified: Secondary | ICD-10-CM | POA: Diagnosis not present

## 2020-06-18 DIAGNOSIS — Z20822 Contact with and (suspected) exposure to covid-19: Secondary | ICD-10-CM | POA: Diagnosis not present

## 2020-06-18 DIAGNOSIS — J029 Acute pharyngitis, unspecified: Secondary | ICD-10-CM | POA: Diagnosis not present

## 2020-07-06 ENCOUNTER — Other Ambulatory Visit: Payer: Self-pay | Admitting: Family Medicine

## 2020-07-09 NOTE — Telephone Encounter (Signed)
Last office visit--06/12/20 Last refill- 06/12/20--30 tabs no refills

## 2020-09-12 DIAGNOSIS — Z5181 Encounter for therapeutic drug level monitoring: Secondary | ICD-10-CM | POA: Diagnosis not present

## 2020-09-12 DIAGNOSIS — Z79899 Other long term (current) drug therapy: Secondary | ICD-10-CM | POA: Diagnosis not present

## 2020-09-12 DIAGNOSIS — Z79891 Long term (current) use of opiate analgesic: Secondary | ICD-10-CM | POA: Diagnosis not present

## 2020-09-12 DIAGNOSIS — M5136 Other intervertebral disc degeneration, lumbar region: Secondary | ICD-10-CM | POA: Diagnosis not present

## 2020-09-12 DIAGNOSIS — M5030 Other cervical disc degeneration, unspecified cervical region: Secondary | ICD-10-CM | POA: Diagnosis not present

## 2020-09-12 DIAGNOSIS — G894 Chronic pain syndrome: Secondary | ICD-10-CM | POA: Diagnosis not present

## 2020-10-07 ENCOUNTER — Other Ambulatory Visit: Payer: Self-pay | Admitting: Family Medicine

## 2020-10-08 NOTE — Telephone Encounter (Signed)
Last refill- 04/23/2020-90 tabs with 1 refill Last office visit- 05/23/2020  No future appointment has been scheduled

## 2020-10-25 ENCOUNTER — Other Ambulatory Visit: Payer: Self-pay | Admitting: Family Medicine

## 2020-12-03 DIAGNOSIS — Z79891 Long term (current) use of opiate analgesic: Secondary | ICD-10-CM | POA: Diagnosis not present

## 2020-12-03 DIAGNOSIS — M5136 Other intervertebral disc degeneration, lumbar region: Secondary | ICD-10-CM | POA: Diagnosis not present

## 2020-12-03 DIAGNOSIS — M47816 Spondylosis without myelopathy or radiculopathy, lumbar region: Secondary | ICD-10-CM | POA: Diagnosis not present

## 2020-12-03 DIAGNOSIS — G894 Chronic pain syndrome: Secondary | ICD-10-CM | POA: Diagnosis not present

## 2021-01-08 ENCOUNTER — Other Ambulatory Visit: Payer: Self-pay | Admitting: Family Medicine

## 2021-01-09 MED ORDER — CLONAZEPAM 0.5 MG PO TABS: 0.5000 mg | ORAL_TABLET | Freq: Two times a day (BID) | ORAL | 0 refills | Status: DC | PRN

## 2021-01-09 NOTE — Telephone Encounter (Signed)
Last video visit- 06/12/20 Last refill- 06/12/20-30 tabs, 0 refills  No future visit scheduled

## 2021-01-31 ENCOUNTER — Other Ambulatory Visit: Payer: Self-pay | Admitting: Family Medicine

## 2021-02-20 ENCOUNTER — Other Ambulatory Visit: Payer: Self-pay

## 2021-02-20 ENCOUNTER — Encounter: Payer: Self-pay | Admitting: Family Medicine

## 2021-02-20 ENCOUNTER — Ambulatory Visit: Payer: BC Managed Care – PPO | Admitting: Family Medicine

## 2021-02-20 VITALS — BP 128/78 | HR 66 | Temp 98.2°F | Wt 168.0 lb

## 2021-02-20 DIAGNOSIS — K625 Hemorrhage of anus and rectum: Secondary | ICD-10-CM

## 2021-02-20 LAB — CBC WITH DIFFERENTIAL/PLATELET
Basophils Absolute: 0.1 10*3/uL (ref 0.0–0.1)
Basophils Relative: 1 % (ref 0.0–3.0)
Eosinophils Absolute: 0.1 10*3/uL (ref 0.0–0.7)
Eosinophils Relative: 1 % (ref 0.0–5.0)
HCT: 41 % (ref 36.0–46.0)
Hemoglobin: 13.6 g/dL (ref 12.0–15.0)
Lymphocytes Relative: 22.8 % (ref 12.0–46.0)
Lymphs Abs: 1.8 10*3/uL (ref 0.7–4.0)
MCHC: 33.3 g/dL (ref 30.0–36.0)
MCV: 82 fl (ref 78.0–100.0)
Monocytes Absolute: 0.5 10*3/uL (ref 0.1–1.0)
Monocytes Relative: 6.6 % (ref 3.0–12.0)
Neutro Abs: 5.3 10*3/uL (ref 1.4–7.7)
Neutrophils Relative %: 68.6 % (ref 43.0–77.0)
Platelets: 280 10*3/uL (ref 150.0–400.0)
RBC: 5 Mil/uL (ref 3.87–5.11)
RDW: 14.8 % (ref 11.5–15.5)
WBC: 7.8 10*3/uL (ref 4.0–10.5)

## 2021-02-20 MED ORDER — HYDROCORTISONE ACETATE 25 MG RE SUPP: 25.0000 mg | Freq: Two times a day (BID) | RECTAL | 0 refills | Status: AC

## 2021-02-20 NOTE — Progress Notes (Signed)
   Subjective:    Patient ID: Margaret [name], female    DOB: [birth year], 50 y.o.   MRN: [id number]  HPI Here for the onset this morning or burning in the rectal area, cramping in the lower abdomen, and seeing bright red blood in the toilet after she passed a stool. She tends to be constipated and she takes Milk of Magnesia occasionally. The stool this morning was soft and easy to pass. No fever or nausea. She has never had a colonoscopy.    Review of Systems  Constitutional: Negative.   Respiratory: Negative.    Cardiovascular: Negative.   Gastrointestinal:  Positive for abdominal pain, anal bleeding, constipation and rectal pain. Negative for abdominal distention, nausea and vomiting.  Genitourinary: Negative.       Objective:   Physical Exam Constitutional:      Appearance: Normal appearance. She is not ill-appearing.  Cardiovascular:     Rate and Rhythm: Normal rate and regular rhythm.     Pulses: Normal pulses.     Heart sounds: Normal heart sounds.  Pulmonary:     Effort: Pulmonary effort is normal.     Breath sounds: Normal breath sounds.  Abdominal:     General: Abdomen is flat. Bowel sounds are normal. There is no distension.     Palpations: Abdomen is soft. There is no mass.     Tenderness: There is no guarding or rebound.     Hernia: No hernia is present.     Comments: Mild tenderness across the lower abdomen   Neurological:     Mental Status: She is alert.          Assessment & Plan:  Rectal pain with rectal bleeding, likely a proctitis. We will treat with hydrocortisone suppositories BID. Check a CBC today. Follow up as needed.  Gershon Crane, MD

## 2021-03-05 DIAGNOSIS — M5136 Other intervertebral disc degeneration, lumbar region: Secondary | ICD-10-CM | POA: Diagnosis not present

## 2021-03-05 DIAGNOSIS — G894 Chronic pain syndrome: Secondary | ICD-10-CM | POA: Diagnosis not present

## 2021-03-05 DIAGNOSIS — M47816 Spondylosis without myelopathy or radiculopathy, lumbar region: Secondary | ICD-10-CM | POA: Diagnosis not present

## 2021-03-05 DIAGNOSIS — Z79891 Long term (current) use of opiate analgesic: Secondary | ICD-10-CM | POA: Diagnosis not present

## 2021-04-08 DIAGNOSIS — M5030 Other cervical disc degeneration, unspecified cervical region: Secondary | ICD-10-CM | POA: Diagnosis not present

## 2021-04-08 DIAGNOSIS — G894 Chronic pain syndrome: Secondary | ICD-10-CM | POA: Diagnosis not present

## 2021-04-08 DIAGNOSIS — M5136 Other intervertebral disc degeneration, lumbar region: Secondary | ICD-10-CM | POA: Diagnosis not present

## 2021-04-08 DIAGNOSIS — Z79891 Long term (current) use of opiate analgesic: Secondary | ICD-10-CM | POA: Diagnosis not present

## 2021-04-17 ENCOUNTER — Other Ambulatory Visit: Payer: Self-pay | Admitting: Family Medicine

## 2021-04-24 DIAGNOSIS — Z01419 Encounter for gynecological examination (general) (routine) without abnormal findings: Secondary | ICD-10-CM | POA: Diagnosis not present

## 2021-04-24 DIAGNOSIS — Z1389 Encounter for screening for other disorder: Secondary | ICD-10-CM | POA: Diagnosis not present

## 2021-04-24 DIAGNOSIS — Z130 Encounter for screening for diseases of the blood and blood-forming organs and certain disorders involving the immune mechanism: Secondary | ICD-10-CM | POA: Diagnosis not present

## 2021-04-24 DIAGNOSIS — Z1231 Encounter for screening mammogram for malignant neoplasm of breast: Secondary | ICD-10-CM | POA: Diagnosis not present

## 2021-04-24 DIAGNOSIS — N951 Menopausal and female climacteric states: Secondary | ICD-10-CM | POA: Diagnosis not present

## 2021-04-27 ENCOUNTER — Other Ambulatory Visit: Payer: Self-pay | Admitting: Family Medicine

## 2021-05-15 DIAGNOSIS — E538 Deficiency of other specified B group vitamins: Secondary | ICD-10-CM | POA: Diagnosis not present

## 2021-05-15 DIAGNOSIS — R5383 Other fatigue: Secondary | ICD-10-CM | POA: Diagnosis not present

## 2021-05-15 DIAGNOSIS — F32A Depression, unspecified: Secondary | ICD-10-CM | POA: Diagnosis not present

## 2021-05-15 DIAGNOSIS — E039 Hypothyroidism, unspecified: Secondary | ICD-10-CM | POA: Diagnosis not present

## 2021-05-15 DIAGNOSIS — F419 Anxiety disorder, unspecified: Secondary | ICD-10-CM | POA: Diagnosis not present

## 2021-05-15 DIAGNOSIS — E559 Vitamin D deficiency, unspecified: Secondary | ICD-10-CM | POA: Diagnosis not present

## 2021-05-22 ENCOUNTER — Other Ambulatory Visit: Payer: Self-pay | Admitting: Family Medicine

## 2021-05-22 NOTE — Telephone Encounter (Signed)
Pt LOV was on 02/20/2021 Last refill was done on 01/09/2021 Please advise

## 2021-05-23 DIAGNOSIS — F419 Anxiety disorder, unspecified: Secondary | ICD-10-CM | POA: Diagnosis not present

## 2021-05-23 DIAGNOSIS — R5383 Other fatigue: Secondary | ICD-10-CM | POA: Diagnosis not present

## 2021-05-23 DIAGNOSIS — F32A Depression, unspecified: Secondary | ICD-10-CM | POA: Diagnosis not present

## 2021-05-23 DIAGNOSIS — E039 Hypothyroidism, unspecified: Secondary | ICD-10-CM | POA: Diagnosis not present

## 2021-05-29 NOTE — Telephone Encounter (Signed)
Please see message from patient in Medication renewal request.

## 2021-06-20 ENCOUNTER — Telehealth: Payer: Self-pay | Admitting: *Deleted

## 2021-06-20 NOTE — Telephone Encounter (Signed)
While prepping chart for PV on 07-03-21, noted pt saw her PCP 02-20-21 for rectal bleeding.  No further incidences noted in chart.  I spoke with pt about this- she states it was a one time occurrence only and she has had no issues or bleeding noted since that time.  Will proceed with PV and she will let nurse know if she has any issues between now and that date.

## 2021-06-25 DIAGNOSIS — H6501 Acute serous otitis media, right ear: Secondary | ICD-10-CM | POA: Diagnosis not present

## 2021-06-25 DIAGNOSIS — H9201 Otalgia, right ear: Secondary | ICD-10-CM | POA: Diagnosis not present

## 2021-06-25 DIAGNOSIS — I10 Essential (primary) hypertension: Secondary | ICD-10-CM | POA: Diagnosis not present

## 2021-07-03 ENCOUNTER — Other Ambulatory Visit: Payer: Self-pay

## 2021-07-03 ENCOUNTER — Ambulatory Visit (AMBULATORY_SURGERY_CENTER): Payer: BC Managed Care – PPO | Admitting: *Deleted

## 2021-07-03 VITALS — Ht 61.0 in | Wt 165.0 lb

## 2021-07-03 DIAGNOSIS — Z1211 Encounter for screening for malignant neoplasm of colon: Secondary | ICD-10-CM

## 2021-07-03 MED ORDER — PEG 3350-KCL-NA BICARB-NACL 420 G PO SOLR
4000.0000 mL | Freq: Once | ORAL | 0 refills | Status: AC
Start: 2021-07-03 — End: 2021-07-03

## 2021-07-03 NOTE — Progress Notes (Signed)
Pt's previsit is done over the phone and all paperwork (prep instructions, blank consent form to just read over) sent to patient.  Pt's name and DOB verified at the beginning of the previsit.  Pt denies any difficulty with ambulating.    No trouble with anesthesia, denies being told they were difficult to intubate, or hx/fam hx of malignant hyperthermia per pt   No egg or soy allergy  No home oxygen use   No medications for weight loss taken  emmi information given  Pt has constipation issues- 2 day Golytely/Miralax prep given  Pt informed that we do not do prior authorizations for prep

## 2021-07-08 ENCOUNTER — Encounter: Payer: Self-pay | Admitting: Gastroenterology

## 2021-07-08 DIAGNOSIS — Z5181 Encounter for therapeutic drug level monitoring: Secondary | ICD-10-CM | POA: Diagnosis not present

## 2021-07-08 DIAGNOSIS — M47816 Spondylosis without myelopathy or radiculopathy, lumbar region: Secondary | ICD-10-CM | POA: Diagnosis not present

## 2021-07-08 DIAGNOSIS — G894 Chronic pain syndrome: Secondary | ICD-10-CM | POA: Diagnosis not present

## 2021-07-08 DIAGNOSIS — Z79891 Long term (current) use of opiate analgesic: Secondary | ICD-10-CM | POA: Diagnosis not present

## 2021-07-08 DIAGNOSIS — Z79899 Other long term (current) drug therapy: Secondary | ICD-10-CM | POA: Diagnosis not present

## 2021-07-08 DIAGNOSIS — M5136 Other intervertebral disc degeneration, lumbar region: Secondary | ICD-10-CM | POA: Diagnosis not present

## 2021-07-11 ENCOUNTER — Other Ambulatory Visit: Payer: Self-pay | Admitting: Family Medicine

## 2021-07-11 DIAGNOSIS — F324 Major depressive disorder, single episode, in partial remission: Secondary | ICD-10-CM

## 2021-07-17 ENCOUNTER — Encounter: Payer: BC Managed Care – PPO | Admitting: Gastroenterology

## 2021-07-22 ENCOUNTER — Telehealth: Payer: Self-pay

## 2021-07-22 ENCOUNTER — Other Ambulatory Visit: Payer: Self-pay | Admitting: Family Medicine

## 2021-07-22 DIAGNOSIS — F324 Major depressive disorder, single episode, in partial remission: Secondary | ICD-10-CM

## 2021-07-22 NOTE — Telephone Encounter (Signed)
Prior authorization approved for Trintellix 20mg   Effective from 07/22/2021 through 07/21/2022.  Completed in cover my meds.

## 2021-07-24 ENCOUNTER — Encounter: Payer: BC Managed Care – PPO | Admitting: Gastroenterology

## 2021-07-31 ENCOUNTER — Ambulatory Visit (AMBULATORY_SURGERY_CENTER): Payer: BC Managed Care – PPO | Admitting: Gastroenterology

## 2021-07-31 ENCOUNTER — Encounter: Payer: Self-pay | Admitting: Gastroenterology

## 2021-07-31 ENCOUNTER — Other Ambulatory Visit: Payer: Self-pay

## 2021-07-31 VITALS — BP 110/60 | HR 79 | Temp 98.4°F | Resp 16 | Ht 61.0 in | Wt 165.0 lb

## 2021-07-31 DIAGNOSIS — Z1211 Encounter for screening for malignant neoplasm of colon: Secondary | ICD-10-CM

## 2021-07-31 HISTORY — PX: COLONOSCOPY: SHX174

## 2021-07-31 MED ORDER — SODIUM CHLORIDE 0.9 % IV SOLN: 500.0000 mL | Freq: Once | INTRAVENOUS | Status: DC

## 2021-07-31 NOTE — Op Note (Signed)
[facility] Patient Name: Margaret [name] Procedure Date: 07/31/2021 2:48 PM MRN: [id number] Endoscopist: Meryl Dare , MD Age: 51 Referring MD:  Date of Birth: 10/30/1970 Gender: Female Account #: [id number] Procedure:                Colonoscopy Indications:              Screening for colorectal malignant neoplasm Medicines:                Monitored Anesthesia Care Procedure:                Pre-Anesthesia Assessment:                           - Prior to the procedure, a History and Physical                            was performed, and patient medications and                            allergies were reviewed. The patient's tolerance of                            previous anesthesia was also reviewed. The risks                            and benefits of the procedure and the sedation                            options and risks were discussed with the patient.                            All questions were answered, and informed consent                            was obtained. Prior Anticoagulants: The patient has                            taken no previous anticoagulant or antiplatelet                            agents. ASA Grade Assessment: II - A patient with                            mild systemic disease. After reviewing the risks                            and benefits, the patient was deemed in                            satisfactory condition to undergo the procedure.                           After obtaining informed consent, the colonoscope  was passed under direct vision. Throughout the                            procedure, the patient's blood pressure, pulse, and                            oxygen saturations were monitored continuously. The                            Olympus CF-HQ190L [id number]) Colonoscope was                            introduced through the anus and advanced to the the                            cecum, identified by  appendiceal orifice and                            ileocecal valve. The ileocecal valve, appendiceal                            orifice, and rectum were photographed. The quality                            of the bowel preparation was good. The colonoscopy                            was performed without difficulty. The patient                            tolerated the procedure well. Scope In: 2:59:56 PM Scope Out: 3:11:51 PM Scope Withdrawal Time: 0 hours 8 minutes 45 seconds  Total Procedure Duration: 0 hours 11 minutes 55 seconds  Findings:                 The perianal and digital rectal examinations were                            normal.                           The entire examined colon appeared normal on direct                            and retroflexion views. Complications:            No immediate complications. Estimated blood loss:                            None. Estimated Blood Loss:     Estimated blood loss: none. Impression:               - The entire examined colon is normal on direct and                            retroflexion views.                           -  No specimens collected. Recommendation:           - Repeat colonoscopy in 10 years for screening                            purposes.                           - Patient has a contact number available for                            emergencies. The signs and symptoms of potential                            delayed complications were discussed with the                            patient. Return to normal activities tomorrow.                            Written discharge instructions were provided to the                            patient.                           - Resume previous diet.                           - Continue present medications. Meryl Dare, MD 07/31/2021 3:13:59 PM This report has been signed electronically.

## 2021-07-31 NOTE — Progress Notes (Signed)
Pt non-responsive, VVS, Report to RN  °

## 2021-07-31 NOTE — Patient Instructions (Signed)
YOU HAD AN ENDOSCOPIC PROCEDURE TODAY AT THE [facility]:   Refer to the procedure report that was given to you for any specific questions about what was found during the examination.  If the procedure report does not answer your questions, please call your gastroenterologist to clarify.  If you requested that your care partner not be given the details of your procedure findings, then the procedure report has been included in a sealed envelope for you to review at your convenience later.  YOU SHOULD EXPECT: Some feelings of bloating in the abdomen. Passage of more gas than usual.  Walking can help get rid of the air that was put into your GI tract during the procedure and reduce the bloating. If you had a lower endoscopy (such as a colonoscopy or flexible sigmoidoscopy) you may notice spotting of blood in your stool or on the toilet paper. If you underwent a bowel prep for your procedure, you may not have a normal bowel movement for a few days.  Please Note:  You might notice some irritation and congestion in your nose or some drainage.  This is from the oxygen used during your procedure.  There is no need for concern and it should clear up in a day or so.  SYMPTOMS TO REPORT IMMEDIATELY:   Following lower endoscopy (colonoscopy or flexible sigmoidoscopy):  Excessive amounts of blood in the stool  Significant tenderness or worsening of abdominal pains  Swelling of the abdomen that is new, acute  Fever of 100F or higher  For urgent or emergent issues, a gastroenterologist can be reached at any hour by calling [phone]. Do not use MyChart messaging for urgent concerns.    DIET:  We do recommend a small meal at first, but then you may proceed to your regular diet.  Drink plenty of fluids but you should avoid alcoholic beverages for 24 hours.  ACTIVITY:  You should plan to take it easy for the rest of today and you should NOT DRIVE or use heavy machinery until tomorrow (because  of the sedation medicines used during the test).    FOLLOW UP: Our staff will call the number listed on your records 48-72 hours following your procedure to check on you and address any questions or concerns that you may have regarding the information given to you following your procedure. If we do not reach you, we will leave a message.  We will attempt to reach you two times.  During this call, we will ask if you have developed any symptoms of COVID 19. If you develop any symptoms (ie: fever, flu-like symptoms, shortness of breath, cough etc.) before then, please call [phone].  If you test positive for Covid 19 in the 2 weeks post procedure, please call and report this information to us.    If any biopsies were taken you will be contacted by phone or by letter within the next 1-3 weeks.  Please call us at [phone] if you have not heard about the biopsies in 3 weeks.    SIGNATURES/CONFIDENTIALITY: You and/or your care partner have signed paperwork which will be entered into your electronic medical record.  These signatures attest to the fact that that the information above on your After Visit Summary has been reviewed and is understood.  Full responsibility of the confidentiality of this discharge information lies with you and/or your care-partner. 

## 2021-07-31 NOTE — Progress Notes (Signed)
Pt's states no medical or surgical changes since previsit or office visit.   Vs by CW in adm 

## 2021-07-31 NOTE — Progress Notes (Signed)
History & Physical  Primary Care Physician:  Laurey Morale, MD Primary Gastroenterologist: Lucio Edward, MD  CHIEF COMPLAINT:  CRC screening   HPI: Margaret [name] is a 51 y.o. female CRC screening, average risk, for screening colonoscopy.   Past Medical History:  Diagnosis Date   ALLERGIC RHINITIS 10/23/2009   Allergy    Anxiety    Arthritis    sees Dr. Smith Robert at [name] ; pt denies this   Chronic pain disorder    sees Dr. Eustace Moore at the [facility] in [place], [place] [phone])   Depression    DEPRESSION 10/24/2009   FIBROMYALGIA 10/23/2009   GERD 10/23/2009   Headache(784.0) 10/23/2009   Hemochromatosis    sees Raoul Pitch NP at [facility] in [place] 10/23/2009   HYPERTENSION 10/23/2009   Sleep apnea    no CPAP   Thyroid disease    hypothyroidism    Urinary incontinence     Past Surgical History:  Procedure Laterality Date   ABDOMINAL HYSTERECTOMY  06/23/1998   with ovaries intact   CERVICAL FUSION  06/24/2003   at C5 and C6   TONSILECTOMY, ADENOIDECTOMY, BILATERAL MYRINGOTOMY AND TUBES     UPPER GASTROINTESTINAL ENDOSCOPY      Prior to Admission medications   Medication Sig Start Date End Date Taking? Authorizing Provider  clonazePAM (KLONOPIN) 0.5 MG tablet Take 1 tablet by mouth twice daily as needed for anxiety 05/23/21  Yes Laurey Morale, MD  estradiol (VIVELLE-DOT) 0.05 MG/24HR patch once a week.   Yes [provider]  fluticasone (FLONASE) 50 MCG/ACT nasal spray Place into both nostrils daily. PRN   Yes [provider]  HYDROmorphone (DILAUDID) 4 MG tablet 3 times daily 06/18/21  Yes [provider]  levothyroxine (SYNTHROID) 25 MCG tablet Take 25 mcg by mouth daily before breakfast.    Yes [provider]  magnesium hydroxide (MILK OF MAGNESIA) 400 MG/5ML suspension Take by mouth as needed.   Yes [provider]  metoprolol  succinate (TOPROL-XL) 50 MG 24 hr tablet TAKE 1 TABLET BY MOUTH ONCE DAILY WITH MEALS OR IMMEDIATELY FOLLOWING A MEAL 04/29/21  Yes Laurey Morale, MD  naloxegol oxalate (MOVANTIK) 25 MG TABS tablet Take 1 tablet (25 mg total) by mouth daily. 08/11/17  Yes Laurey Morale, MD  omeprazole (PRILOSEC OTC) 20 MG tablet One every morning 11/04/19  Yes Laurey Morale, MD  oxyCODONE ER [facility]) 18 MG C12A 1 capsule with food 06/10/21  Yes [provider]  progesterone (PROMETRIUM) 200 MG capsule Take 200 mg by mouth at bedtime. 06/10/20  Yes [provider]  thyroid (ARMOUR THYROID) 60 MG tablet Take 1.5 tablets (90 mg total) by mouth daily before breakfast. Patient taking differently: Take 60 mg by mouth daily before breakfast. 08/11/17  Yes Laurey Morale, MD  vortioxetine HBr (TRINTELLIX) 20 MG TABS tablet Take 1 tablet by mouth once daily 07/12/21  Yes Laurey Morale, MD  zolpidem (AMBIEN) 10 MG tablet TAKE 1 TABLET BY MOUTH AT BEDTIME AS NEEDED AS DIRECTED FOR SLEEP 04/18/21  Yes Laurey Morale, MD  hydrocortisone (ANUSOL-HC) 25 MG suppository Place 1 suppository (25 mg total) rectally 2 (two) times daily. Patient not taking: Reported on 07/03/2021 02/20/21   Laurey Morale, MD  naloxone [facility]) 4 MG/0.1ML LIQD nasal spray kit Narcan 4 mg/actuation nasal spray Patient not taking: Reported on 07/03/2021 03/08/19   [provider]  Testosterone 12.5 MG/ACT (1%) GEL Place 2 Act onto the skin daily. 08/11/17   Laurey Morale, MD    Current Outpatient Medications  Medication Sig Dispense Refill   clonazePAM (KLONOPIN) 0.5 MG tablet Take 1 tablet by mouth twice daily as needed for anxiety 60 tablet 5   estradiol (VIVELLE-DOT) 0.05 MG/24HR patch once a week.     fluticasone (FLONASE) 50 MCG/ACT nasal spray Place into both nostrils daily. PRN     HYDROmorphone (DILAUDID) 4 MG tablet 3 times daily     levothyroxine (SYNTHROID) 25 MCG tablet Take 25 mcg by mouth daily before breakfast.       magnesium hydroxide (MILK OF MAGNESIA) 400 MG/5ML suspension Take by mouth as needed.     metoprolol succinate (TOPROL-XL) 50 MG 24 hr tablet TAKE 1 TABLET BY MOUTH ONCE DAILY WITH MEALS OR IMMEDIATELY FOLLOWING A MEAL 90 tablet 0   naloxegol oxalate (MOVANTIK) 25 MG TABS tablet Take 1 tablet (25 mg total) by mouth daily. 30 tablet 0   omeprazole (PRILOSEC OTC) 20 MG tablet One every morning 1 tablet 0   oxyCODONE ER (XTAMPZA ER) 18 MG C12A 1 capsule with food     progesterone (PROMETRIUM) 200 MG capsule Take 200 mg by mouth at bedtime.     thyroid (ARMOUR THYROID) 60 MG tablet Take 1.5 tablets (90 mg total) by mouth daily before breakfast. (Patient taking differently: Take 60 mg by mouth daily before breakfast.) 1 tablet 0   vortioxetine HBr (TRINTELLIX) 20 MG TABS tablet Take 1 tablet by mouth once daily 90 tablet 0   zolpidem (AMBIEN) 10 MG tablet TAKE 1 TABLET BY MOUTH AT BEDTIME AS NEEDED AS DIRECTED FOR SLEEP 90 tablet 1   hydrocortisone (ANUSOL-HC) 25 MG suppository Place 1 suppository (25 mg total) rectally 2 (two) times daily. (Patient not taking: Reported on 07/03/2021) 24 suppository 0   naloxone (NARCAN) 4 MG/0.1ML LIQD nasal spray kit Narcan 4 mg/actuation nasal spray (Patient not taking: Reported on 07/03/2021)     Testosterone 12.5 MG/ACT (1%) GEL Place 2 Act onto the skin daily. 1 Bottle 0   Current Facility-Administered Medications  Medication Dose Route Frequency Provider Last Rate Last Admin   0.9 %  sodium chloride infusion  500 mL Intravenous Once Ladene Artist, MD        Allergies as of 07/31/2021 - Review Complete 07/31/2021  Allergen Reaction Noted   Morphine and related Other (See Comments) 03/26/2012   Azithromycin Rash 10/23/2009    Family History  Problem Relation Age of Onset   Hypertension Mother    Heart disease Mother    Diabetes Mother    Diabetes Father    Kidney disease Father        kidney disease   Hypertension Brother    Hypertension  Paternal Aunt    Colon polyps Paternal Grandmother    Coronary artery disease Other        relative <50   Colon cancer Neg Hx    Esophageal cancer Neg Hx    Rectal cancer Neg Hx    Stomach cancer Neg Hx     Social History   Socioeconomic History   Marital status: Divorced    Spouse name: Not on file   Number of children: Not on file   Years of education: Not on file   Highest education level: Not on file  Occupational History   Not on file  Tobacco Use   Smoking status: Former  Types: E-cigarettes   Smokeless tobacco: Never   Tobacco comments:    quit 12/2011  Vaping Use   Vaping Use: Former  Substance and Sexual Activity   Alcohol use: No    Alcohol/week: 0.0 standard drinks   Drug use: No   Sexual activity: Not on file  Other Topics Concern   Not on file  Social History Narrative   Not on file   Social Determinants of Health   Financial Resource Strain: Not on file  Food Insecurity: Not on file  Transportation Needs: Not on file  Physical Activity: Not on file  Stress: Not on file  Social Connections: Not on file  Intimate Partner Violence: Not on file    Review of Systems:  All systems reviewed an negative except where noted in HPI.  Gen: Denies any fever, chills, sweats, anorexia, fatigue, weakness, malaise, weight loss, and sleep disorder CV: Denies chest pain, angina, palpitations, syncope, orthopnea, PND, peripheral edema, and claudication. Resp: Denies dyspnea at rest, dyspnea with exercise, cough, sputum, wheezing, coughing up blood, and pleurisy. GI: Denies vomiting blood, jaundice, and fecal incontinence.   Denies dysphagia or odynophagia. GU : Denies urinary burning, blood in urine, urinary frequency, urinary hesitancy, nocturnal urination, and urinary incontinence. MS: Denies joint pain, limitation of movement, and swelling, stiffness, low back pain, extremity pain. Denies muscle weakness, cramps, atrophy.  Derm: Denies rash, itching, dry skin,  hives, moles, warts, or unhealing ulcers.  Psych: Denies depression, anxiety, memory loss, suicidal ideation, hallucinations, paranoia, and confusion. Heme: Denies bruising, bleeding, and enlarged lymph nodes. Neuro:  Denies any headaches, dizziness, paresthesias. Endo:  Denies any problems with DM, thyroid, adrenal function.   Physical Exam: General:  Alert, well-developed, in NAD Head:  Normocephalic and atraumatic. Eyes:  Sclera clear, no icterus.   Conjunctiva pink. Ears:  Normal auditory acuity. Mouth:  No deformity or lesions.  Neck:  Supple; no masses . Lungs:  Clear throughout to auscultation.   No wheezes, crackles, or rhonchi. No acute distress. Heart:  Regular rate and rhythm; no murmurs. Abdomen:  Soft, nondistended, nontender. No masses, hepatomegaly. No obvious masses.  Normal bowel .    Rectal:  Deferred   Msk:  Symmetrical without gross deformities.. Pulses:  Normal pulses noted. Extremities:  Without edema. Neurologic:  Alert and  oriented x4;  grossly normal neurologically. Skin:  Intact without significant lesions or rashes. Cervical Nodes:  No significant cervical adenopathy. Psych:  Alert and cooperative. Normal mood and affect.   Impression / Plan:   CRC screening, average risk, for screening colonoscopy.   Pricilla Riffle. Fuller Plan  07/31/2021, 2:51 PM See Shea Evans, [facility], to contact our on call provider

## 2021-08-02 ENCOUNTER — Telehealth: Payer: Self-pay

## 2021-08-02 NOTE — Telephone Encounter (Signed)
Left message on follow up call. 

## 2021-08-05 ENCOUNTER — Other Ambulatory Visit: Payer: Self-pay | Admitting: Family Medicine

## 2021-08-12 ENCOUNTER — Other Ambulatory Visit: Payer: Self-pay | Admitting: Family Medicine

## 2021-10-03 DIAGNOSIS — Z79891 Long term (current) use of opiate analgesic: Secondary | ICD-10-CM | POA: Diagnosis not present

## 2021-10-03 DIAGNOSIS — M47816 Spondylosis without myelopathy or radiculopathy, lumbar region: Secondary | ICD-10-CM | POA: Diagnosis not present

## 2021-10-03 DIAGNOSIS — G894 Chronic pain syndrome: Secondary | ICD-10-CM | POA: Diagnosis not present

## 2021-10-03 DIAGNOSIS — M5136 Other intervertebral disc degeneration, lumbar region: Secondary | ICD-10-CM | POA: Diagnosis not present

## 2021-10-18 ENCOUNTER — Other Ambulatory Visit: Payer: Self-pay | Admitting: Family Medicine

## 2021-10-18 DIAGNOSIS — F324 Major depressive disorder, single episode, in partial remission: Secondary | ICD-10-CM

## 2022-01-01 DIAGNOSIS — M47816 Spondylosis without myelopathy or radiculopathy, lumbar region: Secondary | ICD-10-CM | POA: Diagnosis not present

## 2022-01-01 DIAGNOSIS — M5136 Other intervertebral disc degeneration, lumbar region: Secondary | ICD-10-CM | POA: Diagnosis not present

## 2022-01-01 DIAGNOSIS — G894 Chronic pain syndrome: Secondary | ICD-10-CM | POA: Diagnosis not present

## 2022-01-01 DIAGNOSIS — Z79891 Long term (current) use of opiate analgesic: Secondary | ICD-10-CM | POA: Diagnosis not present

## 2022-01-08 ENCOUNTER — Other Ambulatory Visit: Payer: Self-pay | Admitting: Family Medicine

## 2022-01-09 NOTE — Telephone Encounter (Signed)
Last refill- 05/23/2021- 60 tabs, 5 refills Last OV-02/20/21  No future OV scheduled.

## 2022-01-17 ENCOUNTER — Other Ambulatory Visit: Payer: Self-pay | Admitting: Family Medicine

## 2022-01-17 DIAGNOSIS — F324 Major depressive disorder, single episode, in partial remission: Secondary | ICD-10-CM

## 2022-02-14 ENCOUNTER — Encounter: Payer: Self-pay | Admitting: Family Medicine

## 2022-02-14 ENCOUNTER — Ambulatory Visit: Payer: BC Managed Care – PPO | Admitting: Family Medicine

## 2022-02-14 VITALS — BP 128/80 | HR 72 | Temp 98.6°F | Wt 167.0 lb

## 2022-02-14 DIAGNOSIS — F411 Generalized anxiety disorder: Secondary | ICD-10-CM

## 2022-02-14 DIAGNOSIS — F324 Major depressive disorder, single episode, in partial remission: Secondary | ICD-10-CM

## 2022-02-14 MED ORDER — VORTIOXETINE HBR 20 MG PO TABS: 20.0000 mg | ORAL_TABLET | Freq: Every day | ORAL | 3 refills | Status: DC

## 2022-02-14 NOTE — Progress Notes (Signed)
   Subjective:    Patient ID: Margaret [name], female    DOB: 1970/08/02, 51 y.o.   MRN: [id number]  HPI Here to follow up on depression and anxiety. She is doing quite well, and she is pleased with her current medications. She sleeps well and appetite is intact.    Review of Systems  Constitutional: Negative.   Respiratory: Negative.    Cardiovascular: Negative.   Psychiatric/Behavioral: Negative.         Objective:   Physical Exam Constitutional:      Appearance: Normal appearance.  Cardiovascular:     Rate and Rhythm: Normal rate and regular rhythm.     Pulses: Normal pulses.     Heart sounds: Normal heart sounds.  Pulmonary:     Breath sounds: Normal breath sounds.  Neurological:     Mental Status: She is alert.  Psychiatric:        Mood and Affect: Mood normal.        Behavior: Behavior normal.        Thought Content: Thought content normal.           Assessment & Plan:  Her depression and anxiety are stable. We refilled the Trintellix and she can use Clonazepam as needed. Gershon Crane, MD

## 2022-02-25 ENCOUNTER — Other Ambulatory Visit: Payer: Self-pay | Admitting: Family Medicine

## 2022-03-26 DIAGNOSIS — G894 Chronic pain syndrome: Secondary | ICD-10-CM | POA: Diagnosis not present

## 2022-03-26 DIAGNOSIS — Z79891 Long term (current) use of opiate analgesic: Secondary | ICD-10-CM | POA: Diagnosis not present

## 2022-03-26 DIAGNOSIS — M5136 Other intervertebral disc degeneration, lumbar region: Secondary | ICD-10-CM | POA: Diagnosis not present

## 2022-03-26 DIAGNOSIS — M5030 Other cervical disc degeneration, unspecified cervical region: Secondary | ICD-10-CM | POA: Diagnosis not present

## 2022-03-29 ENCOUNTER — Other Ambulatory Visit: Payer: Self-pay | Admitting: Family Medicine

## 2022-04-21 NOTE — Progress Notes (Unsigned)
04/23/2022 Margaret [name] [id number] Aug 10, 1970  Referring provider: Laurey Morale, MD Primary GI doctor: Dr. Fuller Plan  ASSESSMENT AND PLAN:   GERD only able to tolerate minimal PPI, worse nocturnal symptoms, unremarkable EGD 2016, on alarm symptoms Patient states she had a hard time with the anesthesia, and prefers to not have repeat endoscopic evaluation Will start with UGI to evaluate for hiatal hernia, esophagus, and motility, add on pepcid 40 mg QHS with omeperazole 20 mg in the AM, if the UGI is abnormal or continuing symptoms will proceed with EGD.  Weight loss and lifestyle modification discussed Follow up 2 months.   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  -Patient has been advised to make an attempt to improve diet and exercise patterns to aid in weight loss. -Recommended diet heavy in fruits and veggies and low in animal meats, cheeses, and dairy products, appropriate calorie intake  History of Present Illness:  51 y.o. female  with a past medical history of hypertension, hypothyroidism, OSA, depression/anxiety, GERD and others listed below, returns to clinic today for evaluation of GERD, chest pain, cough.  02/2015 EGD completely normal.  07/31/2021 colonoscopy with Dr. Fuller Plan for average screening risk good bowel prep unremarkable recall 10 years.  She was told she had a hiatal hernia but I don't see records and GERD x 25 years, she can only tolerate 20 mg omeprazole.  If she takes more of the omeprazole she has worse GERD, she tried protonix 20 mg without help.  She known triggers foods, she has mechanical bed and sleep elevated, she does not eat after 6 pm, goes to sleep at 10pm.  Then a month  ago she had back to back aspirations that concerned her.  She woke up with acid coming into her throat with burning, she developed cough afterwards.  She was using a lot of advil around this time, stopped this and was good for one month until last week.  These episodes are at night, wake her up, she has burning and throat pain the next day, she spits up acid/foam, has cough for 1-10 days afterwards.  No SOB but can have wheezing at night with lying down.  Denies melena.  She has FM and chronic pain, switched from dilaudid to oxycodone.  She does have early satiety and bloating.  She is on movantik for constipation, still has to take MOM q 3 days.  Denies trouble swallowing.  Denies ETOH. No smoking.  Denies weight loss.     She  reports that she has quit smoking. Her smoking use included e-cigarettes. She has never used smokeless tobacco. She reports that she does not drink alcohol and does not use drugs. Her family history includes Colon polyps in her paternal grandmother; Coronary artery disease in an other family member; Diabetes in her father and mother; Heart disease in her mother; Hypertension in her brother, mother, and paternal aunt; Kidney disease in her father.   Current Medications:   Current Outpatient Medications (Endocrine & Metabolic):    estradiol (VIVELLE-DOT) 0.05 MG/24HR patch, once a week.   levothyroxine (SYNTHROID) 25 MCG tablet, Take 25 mcg by mouth daily before breakfast.    progesterone (PROMETRIUM) 200 MG capsule, Take 200 mg by  mouth at bedtime.   thyroid (ARMOUR THYROID) 60 MG tablet, Take 1.5 tablets (90 mg total) by mouth daily before breakfast. (Patient taking differently: Take 60 mg by mouth daily before breakfast.)  Current Outpatient Medications (Cardiovascular):    metoprolol succinate (TOPROL-XL) 50 MG 24 hr tablet, TAKE 1 TABLET BY MOUTH ONCE DAILY WITH FOOD OR IMMEDIATELY FOLLOWING A MEAL  Current Outpatient Medications (Respiratory):    fluticasone  (FLONASE) 50 MCG/ACT nasal spray, Place into both nostrils daily. PRN  Current Outpatient Medications (Analgesics):    oxyCODONE ER (XTAMPZA ER) 18 MG C12A, 1 capsule with food   Oxycodone HCl 10 MG TABS, Take 10 mg by mouth every 6 (six) hours as needed.   Current Outpatient Medications (Other):    clonazePAM (KLONOPIN) 0.5 MG tablet, Take 1 tablet by mouth twice daily as needed for anxiety   magnesium hydroxide (MILK OF MAGNESIA) 400 MG/5ML suspension, Take by mouth as needed.   naloxegol oxalate (MOVANTIK) 25 MG TABS tablet, Take 1 tablet (25 mg total) by mouth daily.   naloxone (NARCAN) 4 MG/0.1ML LIQD nasal spray kit,    omeprazole (PRILOSEC OTC) 20 MG tablet, One every morning   vortioxetine HBr (TRINTELLIX) 20 MG TABS tablet, Take 1 tablet (20 mg total) by mouth daily.   zolpidem (AMBIEN) 10 MG tablet, TAKE 1 TABLET BY MOUTH AT BEDTIME AS NEEDED AS DIRECTED FOR SLEEP   hydrocortisone (ANUSOL-HC) 25 MG suppository, Place 1 suppository (25 mg total) rectally 2 (two) times daily. (Patient not taking: Reported on 04/23/2022)  Surgical History:  She  has a past surgical history that includes Cervical fusion (06/24/2003); Tonsilectomy, adenoidectomy, bilateral myringotomy and tubes; Abdominal hysterectomy (06/23/1998); Upper gastrointestinal endoscopy; and Colonoscopy (07/31/2021).  Current Medications, Allergies, Past Medical History, Past Surgical History, Family History and Social History were reviewed in [employer] record.  Physical Exam: BP 130/70   Pulse 65   Ht _0  (1.549 m)   Wt 172 lb 8 oz (78.2 kg)   BMI 32.59 kg/m  General:   Pleasant, well developed female in no acute distress Heart : Regular rate and rhythm; no murmurs Pulm: Clear anteriorly; no wheezing Abdomen:  Soft, Obese AB, Sluggish bowel sounds. mild tenderness in the epigastrium. Without guarding and Without rebound, No organomegaly appreciated. Rectal: Not evaluated Extremities:   without  edema. Neurologic:  Alert and  oriented x4;  No focal deficits.  Psych:  Cooperative. Normal mood and affect.   Vladimir Crofts, PA-C 04/23/22

## 2022-04-23 ENCOUNTER — Ambulatory Visit: Payer: BC Managed Care – PPO | Admitting: Physician Assistant

## 2022-04-23 ENCOUNTER — Encounter: Payer: Self-pay | Admitting: Physician Assistant

## 2022-04-23 DIAGNOSIS — K219 Gastro-esophageal reflux disease without esophagitis: Secondary | ICD-10-CM | POA: Diagnosis not present

## 2022-04-23 DIAGNOSIS — Z1211 Encounter for screening for malignant neoplasm of colon: Secondary | ICD-10-CM

## 2022-04-23 MED ORDER — MOTEGRITY 2 MG PO TABS: 1.0000 | ORAL_TABLET | Freq: Every day | ORAL | 0 refills | Status: DC

## 2022-04-23 NOTE — Patient Instructions (Addendum)
Please take your proton pump inhibitor medication, omeprazole  Please take this medication 30 minutes to 1 hour before meals- this makes it more effective.   We are giving you samples of motegrity to try: Take one daily.  Add on pepcid 40 mg once or twice a day, keep every night.  Avoid spicy and acidic foods Avoid fatty foods Limit your intake of coffee, tea, alcohol, and carbonated drinks Work to maintain a healthy weight- can refer to weight loss clinic if you want to Keep the head of the bed elevated at least 3 inches with blocks or a wedge pillow if you are having any nighttime symptoms Stay upright for 2 hours after eating Avoid meals and snacks three to four hours before bedtime  You will be contacted by [facility] in the next 2 days to arrange a upper GI series.  The number on your caller ID will be [phone], please answer when they call.  If you have not heard from them in 2 days please call [phone] to schedule.    You have been scheduled for an Upper GI Series at _____________. Your appointment is on _______________ at __________________. Please arrive 30 minutes prior to your test for registration. Make sure not to eat or drink anything after midnight on the night before your test. If you need to reschedule, please call radiology at [phone]. ________________________________________________________________ An upper GI series uses x rays to help diagnose problems of the upper GI tract, which includes the esophagus, stomach, and duodenum. The duodenum is the first part of the small intestine. An upper GI series is conducted by a radiology technologist or a radiologist--a doctor who specializes in x-ray imaging--at a hospital or outpatient center. While sitting or standing in front of an x-ray machine, the patient drinks barium liquid, which is often white and has a chalky consistency and taste. The barium liquid coats the lining of the upper GI tract and  makes signs of disease show up more clearly on x rays. X-ray video, called fluoroscopy, is used to view the barium liquid moving through the esophagus, stomach, and duodenum. Additional x rays and fluoroscopy are performed while the patient lies on an x-ray table. To fully coat the upper GI tract with barium liquid, the technologist or radiologist may press on the abdomen or ask the patient to change position. Patients hold still in various positions, allowing the technologist or radiologist to take x rays of the upper GI tract at different angles. If a technologist conducts the upper GI series, a radiologist will later examine the images to look for problems.  This test typically takes about 1 hour to complete. __________________________________________________________________   I appreciate the opportunity to care for you. Vicie Mutters, PA-C

## 2022-04-27 ENCOUNTER — Other Ambulatory Visit: Payer: Self-pay | Admitting: Family Medicine

## 2022-04-28 NOTE — Telephone Encounter (Signed)
Pt LOV was on 8/22/02023 Last refill done on 10/07/21 Please advise

## 2022-05-09 DIAGNOSIS — F419 Anxiety disorder, unspecified: Secondary | ICD-10-CM | POA: Diagnosis not present

## 2022-05-09 DIAGNOSIS — F32A Depression, unspecified: Secondary | ICD-10-CM | POA: Diagnosis not present

## 2022-05-09 DIAGNOSIS — R5383 Other fatigue: Secondary | ICD-10-CM | POA: Diagnosis not present

## 2022-05-09 DIAGNOSIS — Z13220 Encounter for screening for lipoid disorders: Secondary | ICD-10-CM | POA: Diagnosis not present

## 2022-05-09 DIAGNOSIS — E039 Hypothyroidism, unspecified: Secondary | ICD-10-CM | POA: Diagnosis not present

## 2022-05-13 ENCOUNTER — Other Ambulatory Visit: Payer: Self-pay | Admitting: Physician Assistant

## 2022-05-13 ENCOUNTER — Ambulatory Visit (HOSPITAL_COMMUNITY)
Admission: RE | Admit: 2022-05-13 | Discharge: 2022-05-13 | Disposition: A | Discharge status: Home / Self Care | Payer: BC Managed Care – PPO | Source: Ambulatory Visit | Attending: Physician Assistant | Admitting: Physician Assistant

## 2022-05-13 ENCOUNTER — Encounter: Payer: BC Managed Care – PPO | Admitting: Gastroenterology

## 2022-05-13 DIAGNOSIS — K219 Gastro-esophageal reflux disease without esophagitis: Secondary | ICD-10-CM | POA: Diagnosis not present

## 2022-05-13 DIAGNOSIS — K224 Dyskinesia of esophagus: Secondary | ICD-10-CM | POA: Diagnosis not present

## 2022-05-21 LAB — LAB REPORT - SCANNED
A1c: 6.1
EGFR: 64

## 2022-06-02 DIAGNOSIS — F419 Anxiety disorder, unspecified: Secondary | ICD-10-CM | POA: Diagnosis not present

## 2022-06-02 DIAGNOSIS — N951 Menopausal and female climacteric states: Secondary | ICD-10-CM | POA: Diagnosis not present

## 2022-06-02 DIAGNOSIS — M797 Fibromyalgia: Secondary | ICD-10-CM | POA: Diagnosis not present

## 2022-06-03 ENCOUNTER — Other Ambulatory Visit: Payer: Self-pay | Admitting: Family Medicine

## 2022-07-01 DIAGNOSIS — Z1389 Encounter for screening for other disorder: Secondary | ICD-10-CM | POA: Diagnosis not present

## 2022-07-01 DIAGNOSIS — Z130 Encounter for screening for diseases of the blood and blood-forming organs and certain disorders involving the immune mechanism: Secondary | ICD-10-CM | POA: Diagnosis not present

## 2022-07-01 DIAGNOSIS — Z1231 Encounter for screening mammogram for malignant neoplasm of breast: Secondary | ICD-10-CM | POA: Diagnosis not present

## 2022-07-01 DIAGNOSIS — Z01419 Encounter for gynecological examination (general) (routine) without abnormal findings: Secondary | ICD-10-CM | POA: Diagnosis not present

## 2022-07-01 DIAGNOSIS — N951 Menopausal and female climacteric states: Secondary | ICD-10-CM | POA: Diagnosis not present

## 2022-07-02 DIAGNOSIS — G894 Chronic pain syndrome: Secondary | ICD-10-CM | POA: Diagnosis not present

## 2022-07-02 DIAGNOSIS — M5030 Other cervical disc degeneration, unspecified cervical region: Secondary | ICD-10-CM | POA: Diagnosis not present

## 2022-07-02 DIAGNOSIS — M47816 Spondylosis without myelopathy or radiculopathy, lumbar region: Secondary | ICD-10-CM | POA: Diagnosis not present

## 2022-07-02 DIAGNOSIS — Z79899 Other long term (current) drug therapy: Secondary | ICD-10-CM | POA: Diagnosis not present

## 2022-07-02 DIAGNOSIS — M5136 Other intervertebral disc degeneration, lumbar region: Secondary | ICD-10-CM | POA: Diagnosis not present

## 2022-07-02 DIAGNOSIS — Z5181 Encounter for therapeutic drug level monitoring: Secondary | ICD-10-CM | POA: Diagnosis not present

## 2022-07-02 NOTE — Progress Notes (Signed)
07/04/2022 Margaret [name] [id number] [birth year]  Referring provider: Laurey Morale, MD Primary GI doctor: Dr. Fuller Plan  ASSESSMENT AND PLAN:   GERD with severe epigastric pain EGD 2016 unremarkable, has tried several PPI's without help, does not tolerate higher doses of PPI or states no help.  UGI with severe GERD, gastric fold thickening Lifestyle changes discussed, avoid NSAIDS, ETOH Complaining of refractory GERD symptoms with epigastric pain despite PPI therapy. Will add on carafate at night, continue pepcid at night Will add on dexilant in the morning since failure of prilosec, lansoprazole, and protonix. Patient may be interested in TIF, will repeat EGD to evaluate, ncertain with her history if she would be a candidate, and may need further work up pending results of EGD.  In the mean time, weight loss would also be beneficial with the patient, will refer to weight loss clinic, she is not interested in bariatric surgery.  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  Can consider movantik and linzess, patient wants to continue the same for now  Morbid obesity with OSA, hypothyroidism, GERD Body mass index is 31.55 kg/m.  -will refer to weight loss clinic.   Screening colon Recall colon 07/2032  History of Present Illness:  52 y.o. female  with a past medical history of hypertension, hypothyroidism, OSA, depression/anxiety, GERD and others listed below, returns to clinic today for evaluation of GERD, chest pain, cough.  02/2015 EGD completely normal.  07/31/2021 colonoscopy with Dr. Fuller Plan for average screening risk good bowel prep unremarkable recall 10 years. 05/13/2022 upper GI mild esophageal dysmotility, small hiatal hernia with abundant GERD to  level of proximal midesophagus multiple points even with patient's had slightly elevated, stomach with mild fold thickening  She has FM and chronic pain, switched from dilaudid to oxycodone.  Patient given trial of Motegrity, previously on Movantik. With the motegrity she was 60 % better, was every other day Bm's, only happens if she takes MOM or magnesium q 3 days, had to do even with the motegrity.  States she has severe dryness with skin/eyes, mouth, has to walk around with water, ? Sjogren's.  Since barium swallow, she continues to have constipation but the Bm's are loose.  She has GB, with GB removed.   Patient placed on Pepcid at night and she is on omeprazole 20 MG daily at night.  She has tried protonix, lansoprazole, omeprazole. States she felt increasing the PPI to BID made it worse. Has never tried dexilant.  Patient presents with continuing severe epigastric pain, can radiate left or right, never to her back. States worse with tomato based products.  No melena.  No NSAIDS, makes it worse.   Denies trouble swallowing.  Denies ETOH. No smoking.  Denies weight loss, has had weight gain and this has made it worse.      She  reports that she has quit smoking. Her smoking use included e-cigarettes. She has never used smokeless tobacco. She reports that she does not drink alcohol and does not use drugs. Her family history includes Colon polyps in her paternal grandmother; Coronary artery disease in an other family member; Diabetes in her father and mother; Heart disease in her mother; Hypertension in her brother, mother, and paternal aunt; Kidney disease in her father.   Current  Medications:   Current Outpatient Medications (Endocrine & Metabolic):    estradiol (VIVELLE-DOT) 0.05 MG/24HR patch, once a week.   levothyroxine (SYNTHROID) 25 MCG tablet, Take 25 mcg by mouth daily before breakfast.    progesterone (PROMETRIUM) 200 MG capsule, Take 200 mg by mouth at bedtime.   thyroid  (ARMOUR THYROID) 60 MG tablet, Take 1.5 tablets (90 mg total) by mouth daily before breakfast. (Patient taking differently: Take 60 mg by mouth daily before breakfast.)  Current Outpatient Medications (Cardiovascular):    metoprolol succinate (TOPROL-XL) 50 MG 24 hr tablet, TAKE 1 TABLET BY MOUTH ONCE DAILY WITH FOOD OR  IMMEDIATELY  FOLLOWING  A  MEAL  Current Outpatient Medications (Respiratory):    fluticasone (FLONASE) 50 MCG/ACT nasal spray, Place into both nostrils daily. PRN  Current Outpatient Medications (Analgesics):    oxyCODONE ER (XTAMPZA ER) 18 MG C12A, 1 capsule with food   Oxycodone HCl 10 MG TABS, Take 10 mg by mouth every 6 (six) hours as needed.   Current Outpatient Medications (Other):    clonazePAM (KLONOPIN) 0.5 MG tablet, Take 1 tablet by mouth twice daily as needed for anxiety   dexlansoprazole (DEXILANT) 60 MG capsule, Take 1 capsule (60 mg total) by mouth daily.   hydrocortisone (ANUSOL-HC) 25 MG suppository, Place 1 suppository (25 mg total) rectally 2 (two) times daily.   magnesium hydroxide (MILK OF MAGNESIA) 400 MG/5ML suspension, Take by mouth as needed.   naloxegol oxalate (MOVANTIK) 25 MG TABS tablet, Take 1 tablet (25 mg total) by mouth daily.   naloxone (NARCAN) 4 MG/0.1ML LIQD nasal spray kit,    sucralfate (CARAFATE) 1 g tablet, Take 1 tablet (1 g total) by mouth 3 (three) times daily with meals as needed for up to 14 days.   vortioxetine HBr (TRINTELLIX) 20 MG TABS tablet, Take 1 tablet (20 mg total) by mouth daily.   zolpidem (AMBIEN) 10 MG tablet, TAKE 1 TABLET BY MOUTH AT BEDTIME AS NEEDED AS DIRECTED FOR SLEEP  Surgical History:  She  has a past surgical history that includes Cervical fusion (06/24/2003); Tonsilectomy, adenoidectomy, bilateral myringotomy and tubes; Abdominal hysterectomy (06/23/1998); Upper gastrointestinal endoscopy; and Colonoscopy (07/31/2021).  Current Medications, Allergies, Past Medical History, Past Surgical History, Family  History and Social History were reviewed in [employer] record.  Physical Exam: BP 114/64   Pulse 86   Ht 5\' 1"  (1.549 m)   Wt 167 lb (75.8 kg)   BMI 31.55 kg/m  General:   Pleasant, well developed female in no acute distress Heart : Regular rate and rhythm; no murmurs Pulm: Clear anteriorly; no wheezing Abdomen:  Soft, Obese AB, Sluggish bowel sounds. mild tenderness in the epigastrium. Without guarding and Without rebound, No organomegaly appreciated. Rectal: Not evaluated Extremities:  without  edema. Neurologic:  Alert and  oriented x4;  No focal deficits.  Psych:  Cooperative. Normal mood and affect.   Vladimir Crofts, PA-C 07/04/22

## 2022-07-04 ENCOUNTER — Encounter: Payer: Self-pay | Admitting: Physician Assistant

## 2022-07-04 ENCOUNTER — Ambulatory Visit: Payer: BC Managed Care – PPO | Admitting: Physician Assistant

## 2022-07-04 VITALS — BP 114/64 | HR 86 | Ht 61.0 in | Wt 167.0 lb

## 2022-07-04 DIAGNOSIS — K5903 Drug induced constipation: Secondary | ICD-10-CM | POA: Diagnosis not present

## 2022-07-04 DIAGNOSIS — Z6831 Body mass index (BMI) 31.0-31.9, adult: Secondary | ICD-10-CM

## 2022-07-04 DIAGNOSIS — T402X5A Adverse effect of other opioids, initial encounter: Secondary | ICD-10-CM

## 2022-07-04 DIAGNOSIS — R1013 Epigastric pain: Secondary | ICD-10-CM | POA: Diagnosis not present

## 2022-07-04 DIAGNOSIS — E039 Hypothyroidism, unspecified: Secondary | ICD-10-CM

## 2022-07-04 DIAGNOSIS — K219 Gastro-esophageal reflux disease without esophagitis: Secondary | ICD-10-CM

## 2022-07-04 DIAGNOSIS — Z1211 Encounter for screening for malignant neoplasm of colon: Secondary | ICD-10-CM

## 2022-07-04 DIAGNOSIS — G4733 Obstructive sleep apnea (adult) (pediatric): Secondary | ICD-10-CM

## 2022-07-04 MED ORDER — SUCRALFATE 1 G PO TABS: 1.0000 g | ORAL_TABLET | Freq: Three times a day (TID) | ORAL | 0 refills | Status: DC | PRN

## 2022-07-04 MED ORDER — DEXLANSOPRAZOLE 60 MG PO CPDR: 60.0000 mg | DELAYED_RELEASE_CAPSULE | Freq: Every day | ORAL | 1 refills | Status: DC

## 2022-07-04 NOTE — Patient Instructions (Addendum)
_______________________________________________________  If your blood pressure at your visit was 140/90 or greater, please contact your primary care physician to follow up on this.  _______________________________________________________  If you are age 53 or older, your body mass index should be between 23-30. Your Body mass index is 31.55 kg/m. If this is out of the aforementioned range listed, please consider follow up with your Primary Care Provider.  If you are age 19 or younger, your body mass index should be between 19-25. Your Body mass index is 31.55 kg/m. If this is out of the aformentioned range listed, please consider follow up with your Primary Care Provider.   ________________________________________________________  The [place] [facility] providers would like to encourage you to use [facility] to communicate with providers for non-urgent requests or questions.  Due to long hold times on the telephone, sending your provider a message by [facility] may be a faster and more efficient way to get a response.  Please allow 48 business hours for a response.  Please remember that this is for non-urgent requests.  _______________________________________________________  Margaret [name] have been scheduled for an abdominal ultrasound at [facility] (1st floor of hospital) on 07/09/2022 at 8:00am. Please arrive 30 minutes prior to your appointment for registration. Make certain not to have anything to eat or drink after midnight prior to your appointment. Should you need to reschedule your appointment, please contact radiology at [phone]. This test typically takes about 30 minutes to perform.  You have been scheduled for an endoscopy. Please follow written instructions given to you at your visit today. If you use inhalers (even only as needed), please bring them with you on the day of your procedure.    Please take your proton pump inhibitor medication, ADD on dexilant 60 mg in the morning, the  pepcid at night and the carafate at night as needed.   Sending a medication called Carafate, this mechanically coats your stomach. Can cause darker stools. Take about 30 mins to 1 hour before food and before bed.  If the pill is too large to take, can dissolve it in water and a small orange juice glass or shot glass and take it more as a liquid.  Please take this medication 30 minutes to 1 hour before meals- this makes it more effective.  Avoid spicy and acidic foods Avoid fatty foods Limit your intake of coffee, tea, alcohol, and carbonated drinks Work to maintain a healthy weight Keep the head of the bed elevated at least 3 inches with blocks or a wedge pillow if you are having any nighttime symptoms Stay upright for 2 hours after eating Avoid meals and snacks three to four hours before bedtime     We can refer you to the weight loss clinic if you would like or you can also talk with your primary care about medical options for weight loss.   For more information on the TIF procedure please visit the website at [email or web address]. If you would like to view an informative video regarding the TIF procedure, please visit:  [email or web address].

## 2022-07-07 ENCOUNTER — Encounter (INDEPENDENT_AMBULATORY_CARE_PROVIDER_SITE_OTHER): Payer: Self-pay

## 2022-07-09 ENCOUNTER — Ambulatory Visit (HOSPITAL_COMMUNITY): Admission: RE | Admit: 2022-07-09 | Payer: BC Managed Care – PPO | Source: Ambulatory Visit

## 2022-07-11 ENCOUNTER — Other Ambulatory Visit (HOSPITAL_COMMUNITY): Payer: Self-pay

## 2022-07-28 ENCOUNTER — Other Ambulatory Visit: Payer: Self-pay | Admitting: Family Medicine

## 2022-07-28 ENCOUNTER — Encounter: Payer: BC Managed Care – PPO | Admitting: Gastroenterology

## 2022-07-30 ENCOUNTER — Telehealth: Payer: Self-pay | Admitting: Pharmacy Technician

## 2022-07-30 ENCOUNTER — Other Ambulatory Visit (HOSPITAL_COMMUNITY): Payer: Self-pay

## 2022-07-30 NOTE — Telephone Encounter (Signed)
Patient Advocate Encounter  Received notification from [facility] that prior authorization for DEXLANSOPRAZOLE 60MG  is required.   PA submitted on 2.7.24 Key [id number] Status is pending

## 2022-08-06 NOTE — Progress Notes (Signed)
HPI F former smoker followed for OSA., complicated by HTN, Allergic Rhinitis, GERD, Hypothyroid, Arthritis, Fibromyalgia, Depression, Hyperlipidemia, Insomnia HST 01/17/20- AHI 6.9/ hr, desaturation to 88%, body weight 159 lbs  ===========================================================================  03/15/20- 68 yoF former smoker followed for OSA., complicated by HTN, Allergic Rhinitis, GERD, Hypothyroid, Arthritis, Fibromyalgia, Depression, Hyperlipidemia, Insomnia HST 01/17/20- AHI 6.9/ hr, desaturation to 88%, body weight 159 lbs Covid vax- 2 Moderna Flu vax- Hesitant - hx previous reactions -----FOLLOWS FOR: hst performed 01/17/20.   Body weight today- 166 lbs We reviewed her sleep study and discussed options, including observation and weight loss. 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 Focusing on her complaints of snoring, fragmented sleep and daytime tiredness, she wishes to start with CPAP. Taking ambien for insomnia.  08/11/22- - 51 yoF former smoker followed for OSA., complicated by HTN, Allergic Rhinitis, GERD, Hypothyroid, Arthritis, Fibromyalgia, Depression, Hyperlipidemia, Insomnia Covid vax- 2 Moderna Flu vax- Hesitant - hx previous reactions CPAP auto 5-15/ [employer]    ordered 03/15/20 Download compliance  She says she stopped using CPAP 6 months ago "not working right for her". Said it aggravated her fibromyalgia and interfered with her frequent nocturia.  We discussed conservative alternatives since her OSA is very mild. She will look at otc oral appliances and may later ask for referral to learn about fitted OAPs. Also describes possible mild bilateral maxillary sinus infection with some pressure and drainage. She will try conservative saline rinse for this first.   ROS-see HPI   + = positive Constitutional:    weight loss, night sweats, fevers, chills, fatigue, lassitude. HEENT:    headaches, difficulty swallowing, tooth/dental problems, sore throat,       sneezing,  itching, ear ache, nasal congestion,+ post nasal drip, snoring CV:    chest pain, orthopnea, PND, swelling in lower extremities, anasarca,                                   dizziness, palpitations Resp:   shortness of breath with exertion or at rest.                productive cough,   non-productive cough, coughing up of blood.              change in color of mucus.  wheezing.   Skin:    rash or lesions. GI:  No-   heartburn, indigestion, abdominal pain, nausea, vomiting, diarrhea,                 change in bowel habits, loss of appetite GU: dysuria, change in color of urine, no urgency or frequency.   flank pain. MS:   joint pain, stiffness, decreased range of motion, back pain. Neuro-     nothing unusual Psych:  change in mood or affect.  depression or+ anxiety.   memory loss.  OBJ- Physical Exam General- Alert, Oriented, Affect-appropriate, Distress- none acute Skin- rash-none, lesions- none, excoriation- none Lymphadenopathy- none Head- atraumatic            Eyes- Gross vision intact, PERRLA, conjunctivae and secretions clear            Ears- Hearing, canals-normal            Nose- Clear, no-Septal dev, mucus, polyps, erosion, perforation             Throat- Mallampati II- III , mucosa clear , drainage- none, tonsils- atrophic Neck- flexible ,  trachea midline, no stridor , thyroid nl, carotid no bruit Chest - symmetrical excursion , unlabored           Heart/CV- RRR , no murmur , no gallop  , no rub, nl s1 s2                           - JVD- none , edema- none, stasis changes- none, varices- none           Lung- clear to P&A, wheeze- none, cough- none , dullness-none, rub- none           Chest wall-  Abd-  Br/ Gen/ Rectal- Not done, not indicated Extrem- cyanosis- none, clubbing, none, atrophy- none, strength- nl Neuro- grossly intact to observation

## 2022-08-11 ENCOUNTER — Encounter: Payer: Self-pay | Admitting: Internal Medicine

## 2022-08-11 ENCOUNTER — Ambulatory Visit: Payer: BC Managed Care – PPO | Admitting: Internal Medicine

## 2022-08-11 VITALS — BP 118/82 | HR 72 | Ht 61.0 in | Wt 165.8 lb

## 2022-08-11 DIAGNOSIS — G4733 Obstructive sleep apnea (adult) (pediatric): Secondary | ICD-10-CM | POA: Diagnosis not present

## 2022-08-11 DIAGNOSIS — J301 Allergic rhinitis due to pollen: Secondary | ICD-10-CM | POA: Diagnosis not present

## 2022-08-11 NOTE — Assessment & Plan Note (Signed)
This is very early in Spring pollen season. She thinks she might be getting a sinus infection. We discussed options and she will try saline rinse, then call for antibiotic later if needed.

## 2022-08-11 NOTE — Assessment & Plan Note (Signed)
She found CPAP more difficult than it was worth to her. Plan- She will look at otc oral appliances. We can see heer again if needed in the future

## 2022-08-11 NOTE — Patient Instructions (Signed)
Ok to look in drug stores for a mouthpiece to help with snoring and sleep apnea (not grinding).  Call back if you would like referral to someone who makes more formal fitted oral appliances for sleep apnea.

## 2022-09-24 DIAGNOSIS — M47816 Spondylosis without myelopathy or radiculopathy, lumbar region: Secondary | ICD-10-CM | POA: Diagnosis not present

## 2022-09-24 DIAGNOSIS — Z79891 Long term (current) use of opiate analgesic: Secondary | ICD-10-CM | POA: Diagnosis not present

## 2022-09-24 DIAGNOSIS — G894 Chronic pain syndrome: Secondary | ICD-10-CM | POA: Diagnosis not present

## 2022-09-24 DIAGNOSIS — M5136 Other intervertebral disc degeneration, lumbar region: Secondary | ICD-10-CM | POA: Diagnosis not present

## 2022-09-26 ENCOUNTER — Other Ambulatory Visit: Payer: Self-pay

## 2022-09-26 ENCOUNTER — Other Ambulatory Visit: Payer: Self-pay | Admitting: Family Medicine

## 2022-09-26 DIAGNOSIS — I10 Essential (primary) hypertension: Secondary | ICD-10-CM

## 2022-09-26 MED ORDER — METOPROLOL SUCCINATE ER 50 MG PO TB24
ORAL_TABLET | ORAL | 2 refills | Status: DC
Start: 2022-09-26 — End: 2022-12-29

## 2022-09-26 NOTE — Telephone Encounter (Signed)
Last OV-02/14/22 Last refill-01/09/22--60 tabs, 5 refills  No future OV scheduled.

## 2022-10-14 NOTE — Telephone Encounter (Signed)
PA has been APPROVED from 07/30/2022 through 07/29/2023

## 2022-10-22 ENCOUNTER — Other Ambulatory Visit: Payer: Self-pay | Admitting: Family Medicine

## 2022-10-23 NOTE — Telephone Encounter (Signed)
Pt LOV was on 02/14/22 Last refill was done on 04/28/22 Please advise

## 2022-10-24 ENCOUNTER — Encounter: Payer: Self-pay | Admitting: Family Medicine

## 2022-10-27 NOTE — Telephone Encounter (Signed)
The letter is ready  

## 2022-12-02 DIAGNOSIS — E559 Vitamin D deficiency, unspecified: Secondary | ICD-10-CM | POA: Diagnosis not present

## 2022-12-02 DIAGNOSIS — E612 Magnesium deficiency: Secondary | ICD-10-CM | POA: Diagnosis not present

## 2022-12-02 DIAGNOSIS — F419 Anxiety disorder, unspecified: Secondary | ICD-10-CM | POA: Diagnosis not present

## 2022-12-02 DIAGNOSIS — R202 Paresthesia of skin: Secondary | ICD-10-CM | POA: Diagnosis not present

## 2022-12-02 DIAGNOSIS — M6281 Muscle weakness (generalized): Secondary | ICD-10-CM | POA: Diagnosis not present

## 2022-12-02 DIAGNOSIS — E538 Deficiency of other specified B group vitamins: Secondary | ICD-10-CM | POA: Diagnosis not present

## 2022-12-02 DIAGNOSIS — G8929 Other chronic pain: Secondary | ICD-10-CM | POA: Diagnosis not present

## 2022-12-02 DIAGNOSIS — E039 Hypothyroidism, unspecified: Secondary | ICD-10-CM | POA: Diagnosis not present

## 2022-12-05 LAB — LAB REPORT - SCANNED
A1c: 5.9
EGFR: 68

## 2022-12-28 ENCOUNTER — Other Ambulatory Visit: Payer: Self-pay | Admitting: Family Medicine

## 2022-12-28 DIAGNOSIS — I10 Essential (primary) hypertension: Secondary | ICD-10-CM

## 2022-12-31 DIAGNOSIS — Z79891 Long term (current) use of opiate analgesic: Secondary | ICD-10-CM | POA: Diagnosis not present

## 2022-12-31 DIAGNOSIS — M5136 Other intervertebral disc degeneration, lumbar region: Secondary | ICD-10-CM | POA: Diagnosis not present

## 2022-12-31 DIAGNOSIS — M5030 Other cervical disc degeneration, unspecified cervical region: Secondary | ICD-10-CM | POA: Diagnosis not present

## 2022-12-31 DIAGNOSIS — M47816 Spondylosis without myelopathy or radiculopathy, lumbar region: Secondary | ICD-10-CM | POA: Diagnosis not present

## 2023-01-26 ENCOUNTER — Other Ambulatory Visit: Payer: Self-pay | Admitting: Family Medicine

## 2023-01-26 DIAGNOSIS — I10 Essential (primary) hypertension: Secondary | ICD-10-CM

## 2023-01-28 DIAGNOSIS — R5383 Other fatigue: Secondary | ICD-10-CM | POA: Diagnosis not present

## 2023-01-28 DIAGNOSIS — M2550 Pain in unspecified joint: Secondary | ICD-10-CM | POA: Diagnosis not present

## 2023-01-28 DIAGNOSIS — N951 Menopausal and female climacteric states: Secondary | ICD-10-CM | POA: Diagnosis not present

## 2023-01-28 DIAGNOSIS — E039 Hypothyroidism, unspecified: Secondary | ICD-10-CM | POA: Diagnosis not present

## 2023-01-28 DIAGNOSIS — G8929 Other chronic pain: Secondary | ICD-10-CM | POA: Diagnosis not present

## 2023-01-28 DIAGNOSIS — M797 Fibromyalgia: Secondary | ICD-10-CM | POA: Diagnosis not present

## 2023-02-13 ENCOUNTER — Other Ambulatory Visit: Payer: Self-pay | Admitting: Family Medicine

## 2023-02-13 DIAGNOSIS — F324 Major depressive disorder, single episode, in partial remission: Secondary | ICD-10-CM

## 2023-02-20 ENCOUNTER — Other Ambulatory Visit: Payer: Self-pay | Admitting: Family Medicine

## 2023-02-20 DIAGNOSIS — I10 Essential (primary) hypertension: Secondary | ICD-10-CM

## 2023-02-27 NOTE — Telephone Encounter (Signed)
Pt has an appt on 03-10-2023

## 2023-03-10 ENCOUNTER — Encounter: Payer: Self-pay | Admitting: Family Medicine

## 2023-03-10 ENCOUNTER — Ambulatory Visit: Payer: BC Managed Care – PPO | Admitting: Family Medicine

## 2023-03-10 VITALS — BP 130/82 | HR 68 | Temp 98.1°F | Wt 168.8 lb

## 2023-03-10 DIAGNOSIS — G4700 Insomnia, unspecified: Secondary | ICD-10-CM | POA: Diagnosis not present

## 2023-03-10 DIAGNOSIS — I10 Essential (primary) hypertension: Secondary | ICD-10-CM | POA: Diagnosis not present

## 2023-03-10 DIAGNOSIS — F324 Major depressive disorder, single episode, in partial remission: Secondary | ICD-10-CM

## 2023-03-10 DIAGNOSIS — E039 Hypothyroidism, unspecified: Secondary | ICD-10-CM

## 2023-03-10 DIAGNOSIS — R7309 Other abnormal glucose: Secondary | ICD-10-CM

## 2023-03-10 DIAGNOSIS — F411 Generalized anxiety disorder: Secondary | ICD-10-CM

## 2023-03-10 MED ORDER — ZOLPIDEM TARTRATE 10 MG PO TABS: 10.0000 mg | ORAL_TABLET | Freq: Every day | ORAL | 1 refills | Status: DC

## 2023-03-10 MED ORDER — THYROID 60 MG PO TABS: 60.0000 mg | ORAL_TABLET | Freq: Every day | ORAL | 0 refills | Status: AC

## 2023-03-10 MED ORDER — METOPROLOL SUCCINATE ER 50 MG PO TB24
ORAL_TABLET | ORAL | 3 refills | Status: DC
Start: 2023-03-10 — End: 2024-02-15

## 2023-03-10 NOTE — Progress Notes (Signed)
Subjective:    Patient ID: Margaret [name], female    DOB: [birth year], 52 y.o.   MRN: [id number]  HPI Here to follow up on issues. She feels well in general although the fibromyalgia flares up at times. She saw [facility] on 12-02-22, and they ddid extensive lab work. Her Hgb and ferritin levels were normal. Her renal and hepatic functions were normal. Her thyroid levels were normal. Her A1c was stable at 5.9%. her BP has been stable at home.    Review of Systems  Constitutional: Negative.   Respiratory: Negative.    Cardiovascular: Negative.   Genitourinary: Negative.   Musculoskeletal:  Positive for myalgias.  Neurological: Negative.        Objective:   Physical Exam Constitutional:      Appearance: Normal appearance. She is well-developed.  Neck:     Thyroid: No thyromegaly.     Vascular: No JVD.  Cardiovascular:     Rate and Rhythm: Normal rate and regular rhythm.     Pulses: Normal pulses.     Heart sounds: Normal heart sounds. No murmur heard.    No friction rub. No gallop.  Pulmonary:     Effort: Pulmonary effort is normal. No respiratory distress.     Breath sounds: Normal breath sounds. No stridor. No wheezing or rales.  Chest:     Chest wall: No tenderness.  Abdominal:     General: There is no abdominal bruit.     Palpations: Abdomen is not rigid.  Genitourinary:    Vagina: Normal. No erythema, tenderness or bleeding.     Cervix: No cervical motion tenderness, discharge or friability.     Adnexa:        Right: No mass, tenderness or fullness.         Left: No mass, tenderness or fullness.    Musculoskeletal:     Right lower leg: No edema.     Left lower leg: No edema.  Neurological:     Mental Status: She is alert.     Motor: No abnormal muscle tone.     Deep Tendon Reflexes: Reflexes are normal and symmetric.           Assessment & Plan:  Her HTN, depression, anxiety, insomnia, and hyperglycemia are all stable. Medications were  refilled. Gershon Crane, MD

## 2023-03-25 DIAGNOSIS — M47816 Spondylosis without myelopathy or radiculopathy, lumbar region: Secondary | ICD-10-CM | POA: Diagnosis not present

## 2023-03-25 DIAGNOSIS — G894 Chronic pain syndrome: Secondary | ICD-10-CM | POA: Diagnosis not present

## 2023-03-25 DIAGNOSIS — M5136 Other intervertebral disc degeneration, lumbar region with discogenic back pain only: Secondary | ICD-10-CM | POA: Diagnosis not present

## 2023-03-25 DIAGNOSIS — Z79891 Long term (current) use of opiate analgesic: Secondary | ICD-10-CM | POA: Diagnosis not present

## 2023-04-28 ENCOUNTER — Other Ambulatory Visit: Payer: Self-pay | Admitting: Family Medicine

## 2023-04-28 ENCOUNTER — Other Ambulatory Visit: Payer: Self-pay | Admitting: Physician Assistant

## 2023-04-29 NOTE — Telephone Encounter (Signed)
Pt LOV was on 03/10/23 Last refill done on 09/26/22 Please advise

## 2023-05-04 DIAGNOSIS — F419 Anxiety disorder, unspecified: Secondary | ICD-10-CM | POA: Diagnosis not present

## 2023-05-13 ENCOUNTER — Other Ambulatory Visit: Payer: Self-pay | Admitting: Family Medicine

## 2023-05-13 DIAGNOSIS — F324 Major depressive disorder, single episode, in partial remission: Secondary | ICD-10-CM

## 2023-05-14 DIAGNOSIS — F419 Anxiety disorder, unspecified: Secondary | ICD-10-CM | POA: Diagnosis not present

## 2023-06-10 DIAGNOSIS — Z5181 Encounter for therapeutic drug level monitoring: Secondary | ICD-10-CM | POA: Diagnosis not present

## 2023-06-10 DIAGNOSIS — G894 Chronic pain syndrome: Secondary | ICD-10-CM | POA: Diagnosis not present

## 2023-06-10 DIAGNOSIS — Z79891 Long term (current) use of opiate analgesic: Secondary | ICD-10-CM | POA: Diagnosis not present

## 2023-06-10 DIAGNOSIS — M5030 Other cervical disc degeneration, unspecified cervical region: Secondary | ICD-10-CM | POA: Diagnosis not present

## 2023-06-10 DIAGNOSIS — Z79899 Other long term (current) drug therapy: Secondary | ICD-10-CM | POA: Diagnosis not present

## 2023-08-12 ENCOUNTER — Other Ambulatory Visit: Payer: Self-pay | Admitting: Family Medicine

## 2023-08-12 DIAGNOSIS — F324 Major depressive disorder, single episode, in partial remission: Secondary | ICD-10-CM

## 2023-08-14 ENCOUNTER — Encounter: Payer: Self-pay | Admitting: Family Medicine

## 2023-08-14 ENCOUNTER — Telehealth: Payer: BC Managed Care – PPO | Admitting: Family Medicine

## 2023-08-14 VITALS — Ht 61.0 in | Wt 160.0 lb

## 2023-08-14 DIAGNOSIS — J069 Acute upper respiratory infection, unspecified: Secondary | ICD-10-CM

## 2023-08-14 MED ORDER — AMOXICILLIN-POT CLAVULANATE 875-125 MG PO TABS
1.0000 | ORAL_TABLET | Freq: Two times a day (BID) | ORAL | 0 refills | Status: AC
Start: 2023-08-14 — End: 2023-08-24

## 2023-08-14 NOTE — Patient Instructions (Addendum)
-  I hope you get to feeling better soon.  -Prescribed Augmentin 875-125mg  tablet, take 1 tablet every 12 hours for 10 days.  -Continue to use over the counter Mucinex for cough since it is effective. -Continue to additional Tylenol with Norco to help with fever. Please do not take over 4,000mg  of Tylenol in 24 hours. May alternate with Ibuprofen if needed for fever, pain, and headaches.  -Rest, hydrate.  -Recommend to home test for covid and influenza. A lot of your symptoms resembles influenza. If you do have positive, please call the office or send a MyChart message.  -Follow up if not improved.

## 2023-08-14 NOTE — Progress Notes (Signed)
Virtual Visit via Video Note I connected with Margaret [name] on 08/14/23 at 7:51 by a video enabled telemedicine application and verified that I am speaking with the correct person using two identifiers.   Patient Location: Home Provider Location: office -[place].    I discussed the limitations, risks, security and privacy concerns of performing an evaluation and management service by telephone and the availability of in person appointments. I also discussed with the patient that there may be a patient responsible charge related to this service. The patient expressed understanding and agreed to proceed, consent obtained  Chief Complaint  Patient presents with   Fever        Chills   Headache   Facial Pain   Ear Pain   Generalized Body Aches    History of Present Illness: Margaret [name] is a 53 y.o. female with multiple complaints.   Patient return from a cruise on Monday. She reports she developed the following Headache-frontal, intermittent, Fever Chills Ear Pain (both)  Cough-productive, creamy white to bright red, thick; but today not a productive cough yet Sinus pain  Sore throat  "Chest irration" Body aches   Denies blurry vision, chest pain, SHOB.   Patient has been taking Norco, which she usually takes, with additional Tylenol and took Ibuprofen for fever. She reports her GERD symptoms become worse when taking Ibuprofen and acid comes up. She is also taking Mucinex, Netipods, and Flonase. She thinks the Mucinex is helping.  Denies taking an influenza or covid test at home.   Patient Active Problem List   Diagnosis Date Noted   OSA (obstructive sleep apnea) 12/07/2019   Insomnia 10/31/2016   Chest pain 04/09/2015   Hyperlipidemia 11/23/2014   Hypothyroidism 11/23/2014   Thrush 02/02/2013   Arthritis, multiple joint involvement 11/09/2012   Anxiety state 11/12/2010   Depression 10/24/2009   Essential hypertension 10/23/2009   Allergic rhinitis 10/23/2009    GERD 10/23/2009   Fibromyalgia 10/23/2009   Headache 10/23/2009   URINARY INCONTINENCE 10/23/2009   HYPERGLYCEMIA 10/23/2009   Past Medical History:  Diagnosis Date   ALLERGIC RHINITIS 10/23/2009   Allergy    Anxiety    Arthritis    sees Dr. Viviann Spare at [facility] ; pt denies this   Chronic pain disorder    sees Dr. Ginger Organ at the [facility] in [place], [place] [phone])   Depression    DEPRESSION 10/24/2009   FIBROMYALGIA 10/23/2009   GERD 10/23/2009   Headache(784.0) 10/23/2009   Hemochromatosis    sees Piedad Climes NP at [facility] in [place]    HYPERGLYCEMIA 10/23/2009   HYPERTENSION 10/23/2009   Sleep apnea    no CPAP   Thyroid disease    hypothyroidism    Urinary incontinence    Past Surgical History:  Procedure Laterality Date   ABDOMINAL HYSTERECTOMY  06/23/1998   with ovaries intact   CERVICAL FUSION  06/24/2003   at C5 and C6   COLONOSCOPY  07/31/2021   per Dr. Russella Dar, clear, repeat in 10 yrs   TONSILECTOMY, ADENOIDECTOMY, BILATERAL MYRINGOTOMY AND TUBES     UPPER GASTROINTESTINAL ENDOSCOPY     Allergies  Allergen Reactions   Azithromycin Rash   Prior to Admission medications   Medication Sig Start Date End Date Taking? Authorizing Provider  clonazePAM (KLONOPIN) 0.5 MG tablet Take 1 tablet by mouth twice daily as needed for anxiety 04/29/23  Yes Nelwyn Salisbury, MD  estradiol (VIVELLE-DOT) 0.05 MG/24HR patch  once a week.   Yes [provider]  fluticasone (FLONASE) 50 MCG/ACT nasal spray Place into both nostrils daily. PRN   Yes [provider]  HYDROcodone-acetaminophen (NORCO) 10-325 MG tablet Take 1 tablet by mouth every 6 (six) hours as needed.   Yes [provider]  hydrocortisone (ANUSOL-HC) 25 MG suppository Place 1 suppository (25 mg total) rectally 2 (two) times daily. 02/20/21  Yes Nelwyn Salisbury, MD  levothyroxine (SYNTHROID) 25 MCG tablet Take 25 mcg by mouth  daily before breakfast.    Yes [provider]  magnesium hydroxide (MILK OF MAGNESIA) 400 MG/5ML suspension Take by mouth as needed.   Yes [provider]  metoprolol succinate (TOPROL-XL) 50 MG 24 hr tablet Take 1 tablet by mouth once daily with food 03/10/23  Yes Nelwyn Salisbury, MD  naloxegol oxalate (MOVANTIK) 25 MG TABS tablet Take 1 tablet (25 mg total) by mouth daily. 08/11/17  Yes Nelwyn Salisbury, MD  naloxone [facility]) 4 MG/0.1ML LIQD nasal spray kit  03/08/19  Yes [provider]  progesterone (PROMETRIUM) 200 MG capsule Take 200 mg by mouth at bedtime. 06/10/20  Yes [provider]  sucralfate (CARAFATE) 1 g tablet TAKE 1 TABLET BY MOUTH THREE TIMES DAILY WITH MEALS AS NEEDED FOR UP TO 14 DAYS 04/29/23  Yes Doree Albee, PA-C  thyroid [facility] THYROID) 60 MG tablet Take 1 tablet (60 mg total) by mouth daily before breakfast. 03/10/23  Yes Nelwyn Salisbury, MD  TRINTELLIX 20 MG TABS tablet Take 1 tablet by mouth once daily 08/12/23  Yes Nelwyn Salisbury, MD  zolpidem (AMBIEN) 10 MG tablet Take 1 tablet (10 mg total) by mouth at bedtime. 03/10/23  Yes Nelwyn Salisbury, MD   Social History   Socioeconomic History   Marital status: Divorced    Spouse name: Not on file   Number of children: Not on file   Years of education: Not on file   Highest education level: Associate degree: occupational, Scientist, product/process development, or vocational program  Occupational History   Not on file  Tobacco Use   Smoking status: Former    Types: E-cigarettes   Smokeless tobacco: Never   Tobacco comments:    quit 12/2011  Vaping Use   Vaping status: Former  Substance and Sexual Activity   Alcohol use: No    Alcohol/week: 0.0 standard drinks of alcohol   Drug use: No   Sexual activity: Not on file  Other Topics Concern   Not on file  Social History Narrative   Not on file   Social Drivers of Health   Financial Resource Strain: Low Risk  (08/13/2023)   Overall Financial Resource Strain  (CARDIA)    Difficulty of Paying Living Expenses: Not very hard  Food Insecurity: No Food Insecurity (08/13/2023)   Hunger Vital Sign    Worried About Running Out of Food in the Last Year: Never true    Ran Out of Food in the Last Year: Never true  Transportation Needs: Patient Declined (08/13/2023)   PRAPARE - Transportation    Lack of Transportation (Medical): Patient declined    Lack of Transportation (Non-Medical): Patient declined  Physical Activity: Unknown (08/13/2023)   Exercise Vital Sign    Days of Exercise per Week: Patient declined    Minutes of Exercise per Session: Not on file  Stress: No Stress Concern Present (08/13/2023)   [employer] of Occupational Health - Occupational Stress Questionnaire    Feeling of Stress : Not  at all  Social Connections: Moderately Integrated (08/13/2023)   Social Connection and Isolation Panel [NHANES]    Frequency of Communication with Friends and Family: Three times a week    Frequency of Social Gatherings with Friends and Family: Patient declined    Attends Religious Services: 1 to 4 times per year    Active Member of [employer] or Organizations: No    Attends Engineer, structural: Not on file    Marital Status: Living with partner  Intimate Partner Violence: Unknown (09/24/2021)   Received from [employer], [facility]   HITS    Physically Hurt: Not on file    Insult or Talk Down To: Not on file    Threaten Physical Harm: Not on file    Scream or Curse: Not on file    Observations/Objective: Today's Vitals   08/14/23 0747  Weight: 160 lb (72.6 kg)  Height: 5\' 1"  (1.549 m)  Patient unable to obtain vital signs due to not having access to equipment.   Physical Exam Constitutional:      General: She is not in acute distress. Pulmonary:     Effort: No respiratory distress.  Neurological:     Mental Status: She is alert and oriented to person, place, and time.  Psychiatric:        Mood and Affect: Mood normal.         Speech: Speech normal.        Behavior: Behavior normal.    Assessment and Plan: UPPER RESPIRATORY TRACT INFECTION -Prescribed Augmentin 875-125mg  tablet, take 1 tablet every 12 hours for 10 days.  -Continue to use over the counter Mucinex for cough since it is effective. -Continue to additional Tylenol with Norco to help with fever. Please do not take over 4,000mg  of Tylenol in 24 hours. May alternate with Ibuprofen if needed for fever, pain, and headaches.  -Rest, hydrate.  -Recommend to home test for covid and influenza. A lot of your symptoms resembles influenza. If you do have positive, please call the office or send a MyChart message.   Follow Up Instructions: If not improved.    I discussed the assessment and treatment plan with the patient. The patient was provided an opportunity to ask questions and all were answered. The patient agreed with the plan and demonstrated an understanding of the instructions.   The patient was advised to call back or seek an in-person evaluation if the symptoms worsen or if the condition fails to improve as anticipated.  Zandra Abts, NP

## 2023-08-28 ENCOUNTER — Ambulatory Visit: Payer: Self-pay | Admitting: Family Medicine

## 2023-08-28 NOTE — Telephone Encounter (Signed)
 Chief Complaint: Sore throat Symptoms: Sore throat, pain when swallowing Frequency: two days Pertinent Negatives: Patient denies rash, difficulty breathing Disposition: [] ED /[] Urgent Care (no appt availability in office) / [x] Appointment(In office/virtual)/ []  [facility]/ [] Home Care/ [] Refused Recommended Disposition /[] [facility]/ []  Follow-up with PCP Additional Notes: Patient called in stating she has had a sore throat for two days. Patient has pain when swallowing. Patient states this feels more like sore throat and less like post nasal drip pain. Patient unsure if she has fever but states she has chills. Patient denies rash or difficulty breathing. Patient advised to be evaluated within 24 hours - patient provided information for nearest Urgent Care.    Copied from CRM [id number]. Topic: General - Call Back - No Documentation >> Aug 28, 2023  4:06 PM Margaret [name] wrote: Reason for CRM: Patient called back. Told to transfer to Triage. Encounter already created Reason for Disposition  SEVERE (e.g., excruciating) throat pain  Answer Assessment - Initial Assessment Questions 1. ONSET: "When did the throat start hurting?" (Hours or days ago)      Two nights ago 2. SEVERITY: "How bad is the sore throat?" (Scale 1-10; mild, moderate or severe)   - MILD (1-3):  Doesn't interfere with eating or normal activities.   - MODERATE (4-7): Interferes with eating some solids and normal activities.   - SEVERE (8-10):  Excruciating pain, interferes with most normal activities.   - SEVERE WITH DYSPHAGIA (10): Can't swallow liquids, drooling.     Moderate - hurts to swallow 3. STREP EXPOSURE: "Has there been any exposure to strep within the past week?" If Yes, ask: "What type of contact occurred?"      No 4.  VIRAL SYMPTOMS: "Are there any symptoms of a cold, such as a runny nose, cough, hoarse voice or red eyes?"      Flu symptoms have resolved 5. FEVER: "Do you have a fever?" If Yes,  ask: "What is your temperature, how was it measured, and when did it start?"     Unsure but patient has the chills 6. PUS ON THE TONSILS: "Is there pus on the tonsils in the back of your throat?"     Patient doesn't have tonsils 7. OTHER SYMPTOMS: "Do you have any other symptoms?" (e.g., difficulty breathing, headache, rash)     No  Protocols used: Sore Throat-A-AH

## 2023-08-28 NOTE — Telephone Encounter (Signed)
 Attempted to call patient "call could not be completed as dialed."   Summary: Sore Throat   Copied From CRM [id number]. Reason for Triage: Patient had flu 3 weeks ago and has been off the antibiotics are a few days, says she now has a sore throat and thinks it could be strep

## 2023-08-28 NOTE — Telephone Encounter (Signed)
 Attempted to contact patient, "call could not be completed as dialed."   Summary: Sore Throat   Copied From CRM [id number]. Reason for Triage: Patient had flu 3 weeks ago and has been off the antibiotics are a few days, says she now has a sore throat and thinks it could be strep

## 2023-08-28 NOTE — Telephone Encounter (Signed)
 Attempted to contact patient, "call cannot be completed as dialed."  Reason for Triage: Patient had flu 3 weeks ago and has been off the antibiotics are a few days, says she now has a sore throat and thinks it could be strep

## 2023-08-31 DIAGNOSIS — N951 Menopausal and female climacteric states: Secondary | ICD-10-CM | POA: Diagnosis not present

## 2023-08-31 DIAGNOSIS — Z131 Encounter for screening for diabetes mellitus: Secondary | ICD-10-CM | POA: Diagnosis not present

## 2023-08-31 DIAGNOSIS — M6281 Muscle weakness (generalized): Secondary | ICD-10-CM | POA: Diagnosis not present

## 2023-08-31 DIAGNOSIS — R5383 Other fatigue: Secondary | ICD-10-CM | POA: Diagnosis not present

## 2023-08-31 DIAGNOSIS — G8929 Other chronic pain: Secondary | ICD-10-CM | POA: Diagnosis not present

## 2023-08-31 DIAGNOSIS — E039 Hypothyroidism, unspecified: Secondary | ICD-10-CM | POA: Diagnosis not present

## 2023-08-31 DIAGNOSIS — M797 Fibromyalgia: Secondary | ICD-10-CM | POA: Diagnosis not present

## 2023-08-31 DIAGNOSIS — R202 Paresthesia of skin: Secondary | ICD-10-CM | POA: Diagnosis not present

## 2023-08-31 DIAGNOSIS — E612 Magnesium deficiency: Secondary | ICD-10-CM | POA: Diagnosis not present

## 2023-08-31 NOTE — Telephone Encounter (Signed)
This encounter has been completed °

## 2023-08-31 NOTE — Telephone Encounter (Signed)
 Spoke with pt state that she had a visit at the [facility] today, nothing further needed

## 2023-09-08 DIAGNOSIS — Z79891 Long term (current) use of opiate analgesic: Secondary | ICD-10-CM | POA: Diagnosis not present

## 2023-09-08 DIAGNOSIS — M5417 Radiculopathy, lumbosacral region: Secondary | ICD-10-CM | POA: Diagnosis not present

## 2023-09-08 DIAGNOSIS — G894 Chronic pain syndrome: Secondary | ICD-10-CM | POA: Diagnosis not present

## 2023-09-08 DIAGNOSIS — M5030 Other cervical disc degeneration, unspecified cervical region: Secondary | ICD-10-CM | POA: Diagnosis not present

## 2023-09-28 DIAGNOSIS — Z130 Encounter for screening for diseases of the blood and blood-forming organs and certain disorders involving the immune mechanism: Secondary | ICD-10-CM | POA: Diagnosis not present

## 2023-09-28 DIAGNOSIS — Z1231 Encounter for screening mammogram for malignant neoplasm of breast: Secondary | ICD-10-CM | POA: Diagnosis not present

## 2023-09-28 DIAGNOSIS — Z01419 Encounter for gynecological examination (general) (routine) without abnormal findings: Secondary | ICD-10-CM | POA: Diagnosis not present

## 2023-10-02 ENCOUNTER — Ambulatory Visit

## 2023-10-02 ENCOUNTER — Ambulatory Visit
Admission: EM | Admit: 2023-10-02 | Discharge: 2023-10-02 | Disposition: A | Discharge status: Home / Self Care | Attending: Family Medicine | Admitting: Family Medicine

## 2023-10-02 ENCOUNTER — Encounter: Payer: Self-pay | Admitting: Urgent Care

## 2023-10-02 DIAGNOSIS — M20011 Mallet finger of right finger(s): Secondary | ICD-10-CM

## 2023-10-02 DIAGNOSIS — M79644 Pain in right finger(s): Secondary | ICD-10-CM | POA: Diagnosis not present

## 2023-10-02 NOTE — ED Provider Notes (Addendum)
 [facility]  Note:  This document was prepared using Conservation officer, historic buildings and may include unintentional dictation errors.  MRN: [id number] DOB: Apr 27, 1971  Subjective:   Margaret [name] is a 53 y.o. female presenting for 1 day history of right pinky pain, swelling and bony deformity.  Reports that she fell against her bed last night and jammed her finger.  Has since been unable to extend it.  Today, has minimal pain.  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  No current facility-administered medications for this encounter.  Current Outpatient Medications:    clonazePAM (KLONOPIN) 0.5 MG tablet, Take 1 tablet by mouth twice daily as needed for anxiety, Disp: 60 tablet, Rfl: 5   estradiol (VIVELLE-DOT) 0.05 MG/24HR patch, once a week., Disp: , Rfl:    fluticasone (FLONASE) 50 MCG/ACT nasal spray, Place into both nostrils daily. PRN, Disp: , Rfl:    HYDROcodone-acetaminophen (NORCO) 10-325 MG tablet, Take 1 tablet by mouth every 6 (six) hours as needed., Disp: , Rfl:    hydrocortisone (ANUSOL-HC) 25 MG suppository, Place 1 suppository (25 mg total) rectally 2 (two) times daily., Disp: 24 suppository, Rfl: 0   HYDROmorphone (DILAUDID) 4 MG tablet, Take by mouth., Disp: , Rfl:    levothyroxine (SYNTHROID) 25 MCG tablet, Take 25 mcg by mouth daily before breakfast. , Disp: , Rfl:    magnesium hydroxide (MILK OF MAGNESIA) 400 MG/5ML suspension, Take by mouth as needed., Disp: , Rfl:    metoprolol succinate (TOPROL-XL) 50 MG 24 hr tablet, Take 1 tablet by mouth once daily with food, Disp: 90 tablet, Rfl: 3   MORPHINE SULFATE ER PO, Take 1 tablet by mouth 2 (two) times daily., Disp: , Rfl:    naloxegol oxalate (MOVANTIK) 25 MG TABS tablet, Take 1 tablet (25 mg total) by mouth daily., Disp: 30 tablet, Rfl: 0   naloxone (NARCAN) 4 MG/0.1ML LIQD nasal spray kit, , Disp: , Rfl:    progesterone (PROMETRIUM) 200 MG capsule, Take 200 mg by mouth at  bedtime., Disp: , Rfl:    sucralfate (CARAFATE) 1 g tablet, TAKE 1 TABLET BY MOUTH THREE TIMES DAILY WITH MEALS AS NEEDED FOR UP TO 14 DAYS, Disp: 42 tablet, Rfl: 0   thyroid (ARMOUR THYROID) 60 MG tablet, Take 1 tablet (60 mg total) by mouth daily before breakfast., Disp: 1 tablet, Rfl: 0   TRINTELLIX 20 MG TABS tablet, Take 1 tablet by mouth once daily, Disp: 90 tablet, Rfl: 0   zolpidem (AMBIEN) 10 MG tablet, Take 1 tablet (10 mg total) by mouth at bedtime., Disp: 90 tablet, Rfl: 1   Allergies  Allergen Reactions   Azithromycin Rash    Past Medical History:  Diagnosis Date   ALLERGIC RHINITIS 10/23/2009   Allergy    Anxiety    Arthritis    sees Dr. Viviann Spare at [facility] ; pt denies this   Chronic pain disorder    sees Dr. Ginger Organ at the [facility] in [place], [place] [phone])   Depression    DEPRESSION 10/24/2009   FIBROMYALGIA 10/23/2009   GERD 10/23/2009   Headache(784.0) 10/23/2009   Hemochromatosis    sees Piedad Climes NP at [facility] in [place]    HYPERGLYCEMIA 10/23/2009   HYPERTENSION 10/23/2009   Sleep apnea    no CPAP   Thyroid disease    hypothyroidism    Urinary incontinence      Past Surgical History:  Procedure Laterality Date   ABDOMINAL HYSTERECTOMY  06/23/1998   with ovaries intact   CERVICAL FUSION  06/24/2003   at C5 and C6   COLONOSCOPY  07/31/2021   per Dr. Russella Dar, clear, repeat in 10 yrs   TONSILECTOMY, ADENOIDECTOMY, BILATERAL MYRINGOTOMY AND TUBES     UPPER GASTROINTESTINAL ENDOSCOPY      Family History  Problem Relation Age of Onset   Hypertension Mother    Heart disease Mother    Diabetes Mother    Diabetes Father    Kidney disease Father        kidney disease   Hypertension Brother    Hypertension Paternal Aunt    Colon polyps Paternal Grandmother    Coronary artery disease Other        relative <50   Colon cancer Neg Hx    Esophageal cancer Neg Hx    Rectal  cancer Neg Hx    Stomach cancer Neg Hx     Social History   Tobacco Use   Smoking status: Former    Types: Cigarettes   Smokeless tobacco: Never   Tobacco comments:    quit 12/2011  Vaping Use   Vaping status: Former  Substance Use Topics   Alcohol use: No    Alcohol/week: 0.0 standard drinks of alcohol   Drug use: No    ROS   Objective:   Vitals: BP 130/74 (BP Location: Right Arm)   Pulse 71   Temp 99.1 F (37.3 C) (Oral)   Resp 16   SpO2 95%   Physical Exam Constitutional:      General: She is not in acute distress.    Appearance: Normal appearance. She is well-developed. She is not ill-appearing, toxic-appearing or diaphoretic.  HENT:     Head: Normocephalic and atraumatic.     Nose: Nose normal.     Mouth/Throat:     Mouth: Mucous membranes are moist.  Eyes:     General: No scleral icterus.       Right eye: No discharge.        Left eye: No discharge.     Extraocular Movements: Extraocular movements intact.  Cardiovascular:     Rate and Rhythm: Normal rate.  Pulmonary:     Effort: Pulmonary effort is normal.  Musculoskeletal:       Hands:  Skin:    General: Skin is warm and dry.  Neurological:     General: No focal deficit present.     Mental Status: She is alert and oriented to person, place, and time.  Psychiatric:        Mood and Affect: Mood normal.        Behavior: Behavior normal.     Assessment and Plan :   PDMP not reviewed this encounter.  1. Mallet finger of right hand    High suspicion for mallet finger and subtle avulsion fracture of the distal middle phalanx.  Recommended mallet finger splint.  Placed this on patient's right fifth finger in a neutral position at the DIP.  Patient has minimal pain but advised NSAID therapy as needed.  [facility]-up with [facility] as soon as possible.  Counseled patient on potential for adverse effects with medications prescribed/recommended today, ER and return-to-clinic precautions discussed,  patient verbalized understanding.    Wallis Bamberg, [place] 10/02/23 1337

## 2023-10-02 NOTE — ED Triage Notes (Signed)
 Pt injured right pinky finger when she fell against the bed last night-deformity-NAD-steady gait

## 2023-10-05 DIAGNOSIS — M20011 Mallet finger of right finger(s): Secondary | ICD-10-CM | POA: Diagnosis not present

## 2023-10-05 DIAGNOSIS — M79641 Pain in right hand: Secondary | ICD-10-CM | POA: Diagnosis not present

## 2023-10-28 ENCOUNTER — Other Ambulatory Visit: Payer: Self-pay | Admitting: Family Medicine

## 2023-11-05 ENCOUNTER — Other Ambulatory Visit: Payer: Self-pay | Admitting: Family Medicine

## 2023-11-05 DIAGNOSIS — F324 Major depressive disorder, single episode, in partial remission: Secondary | ICD-10-CM

## 2023-11-30 DIAGNOSIS — M20011 Mallet finger of right finger(s): Secondary | ICD-10-CM | POA: Diagnosis not present

## 2023-12-01 DIAGNOSIS — M5030 Other cervical disc degeneration, unspecified cervical region: Secondary | ICD-10-CM | POA: Diagnosis not present

## 2023-12-01 DIAGNOSIS — M5417 Radiculopathy, lumbosacral region: Secondary | ICD-10-CM | POA: Diagnosis not present

## 2023-12-01 DIAGNOSIS — G894 Chronic pain syndrome: Secondary | ICD-10-CM | POA: Diagnosis not present

## 2023-12-01 DIAGNOSIS — Z79891 Long term (current) use of opiate analgesic: Secondary | ICD-10-CM | POA: Diagnosis not present

## 2023-12-10 ENCOUNTER — Other Ambulatory Visit (HOSPITAL_COMMUNITY): Payer: Self-pay

## 2023-12-10 MED ORDER — MORPHINE SULFATE ER 30 MG PO TBCR
30.0000 mg | EXTENDED_RELEASE_TABLET | Freq: Two times a day (BID) | ORAL | 0 refills | Status: DC
  Filled 2023-12-10: qty 60, 30d supply, fill #0

## 2023-12-11 ENCOUNTER — Other Ambulatory Visit (HOSPITAL_COMMUNITY): Payer: Self-pay

## 2024-01-12 ENCOUNTER — Other Ambulatory Visit: Payer: Self-pay

## 2024-01-12 ENCOUNTER — Other Ambulatory Visit (HOSPITAL_COMMUNITY): Payer: Self-pay

## 2024-01-12 MED ORDER — MORPHINE SULFATE ER 30 MG PO TBCR
30.0000 mg | EXTENDED_RELEASE_TABLET | Freq: Two times a day (BID) | ORAL | 0 refills | Status: AC
  Filled 2024-01-12: qty 60, 30d supply, fill #0

## 2024-02-02 ENCOUNTER — Other Ambulatory Visit: Payer: Self-pay | Admitting: Family Medicine

## 2024-02-02 DIAGNOSIS — F324 Major depressive disorder, single episode, in partial remission: Secondary | ICD-10-CM

## 2024-02-15 ENCOUNTER — Ambulatory Visit: Admitting: Family Medicine

## 2024-02-15 ENCOUNTER — Encounter: Payer: Self-pay | Admitting: Family Medicine

## 2024-02-15 VITALS — BP 124/78 | HR 60 | Temp 98.3°F | Ht 60.5 in | Wt 160.0 lb

## 2024-02-15 DIAGNOSIS — F324 Major depressive disorder, single episode, in partial remission: Secondary | ICD-10-CM

## 2024-02-15 DIAGNOSIS — L989 Disorder of the skin and subcutaneous tissue, unspecified: Secondary | ICD-10-CM

## 2024-02-15 DIAGNOSIS — Z23 Encounter for immunization: Secondary | ICD-10-CM | POA: Diagnosis not present

## 2024-02-15 DIAGNOSIS — I10 Essential (primary) hypertension: Secondary | ICD-10-CM | POA: Diagnosis not present

## 2024-02-15 DIAGNOSIS — Z0000 Encounter for general adult medical examination without abnormal findings: Secondary | ICD-10-CM

## 2024-02-15 MED ORDER — METOPROLOL SUCCINATE ER 50 MG PO TB24: ORAL_TABLET | ORAL | 3 refills | Status: DC

## 2024-02-15 MED ORDER — METOPROLOL SUCCINATE ER 50 MG PO TB24: ORAL_TABLET | ORAL | 3 refills | Status: AC

## 2024-02-15 MED ORDER — OMEPRAZOLE 20 MG PO CPDR: 20.0000 mg | DELAYED_RELEASE_CAPSULE | Freq: Every day | ORAL | 0 refills | Status: AC

## 2024-02-15 MED ORDER — SUCRALFATE 1 G PO TABS: 1.0000 g | ORAL_TABLET | ORAL | 0 refills | Status: DC | PRN

## 2024-02-15 MED ORDER — VORTIOXETINE HBR 20 MG PO TABS: 20.0000 mg | ORAL_TABLET | Freq: Every day | ORAL | 3 refills | Status: AC

## 2024-02-15 MED ORDER — FAMOTIDINE 20 MG PO TABS: 20.0000 mg | ORAL_TABLET | Freq: Every day | ORAL | Status: AC

## 2024-02-15 MED ORDER — TERBINAFINE HCL 250 MG PO TABS: 250.0000 mg | ORAL_TABLET | Freq: Every day | ORAL | 1 refills | Status: AC

## 2024-02-15 MED ORDER — VORTIOXETINE HBR 20 MG PO TABS: 20.0000 mg | ORAL_TABLET | Freq: Every day | ORAL | 0 refills | Status: DC

## 2024-02-15 MED ORDER — SUCRALFATE 1 G PO TABS: 1.0000 g | ORAL_TABLET | Freq: Three times a day (TID) | ORAL | 3 refills | Status: AC

## 2024-02-15 NOTE — Progress Notes (Signed)
 Subjective:    Patient ID: Margaret [name], female    DOB: 1970-12-25, 53 y.o.   MRN: [id number]  HPI Here for a well exam. She has no complaints. Her GERD is stable she takes Omeprazole  and Famotidine  daily, and she adds Sucralfate  as needed. [facility] for hypothyroidism, and they do comprehensive lab work every year. We treated her for toenail fungus a few years ago, and she asks to try this again. She has some skin lesions she is worried about. This includes a lesion on the right forearm. This appeared a year ago, and it is slowly enlarging.    Review of Systems  Constitutional: Negative.   HENT: Negative.    Eyes: Negative.   Respiratory: Negative.    Cardiovascular: Negative.   Gastrointestinal: Negative.   Genitourinary:  Negative for decreased urine volume, difficulty urinating, dyspareunia, dysuria, enuresis, flank pain, frequency, hematuria, pelvic pain and urgency.  Musculoskeletal: Negative.   Skin: Negative.   Neurological: Negative.  Negative for headaches.  Psychiatric/Behavioral: Negative.         Objective:   Physical Exam Constitutional:      General: She is not in acute distress.    Appearance: Normal appearance. She is well-developed.  HENT:     Head: Normocephalic and atraumatic.     Right Ear: External ear normal.     Left Ear: External ear normal.     Nose: Nose normal.     Mouth/Throat:     Pharynx: No oropharyngeal exudate.  Eyes:     General: No scleral icterus.    Conjunctiva/sclera: Conjunctivae normal.     Pupils: Pupils are equal, round, and reactive to light.  Neck:     Thyroid : No thyromegaly.     Vascular: No JVD.  Cardiovascular:     Rate and Rhythm: Normal rate and regular rhythm.     Pulses: Normal pulses.     Heart sounds: Normal heart sounds. No murmur heard.    No friction rub. No gallop.  Pulmonary:     Effort: Pulmonary effort is normal. No respiratory distress.     Breath sounds: Normal breath  sounds. No wheezing or rales.  Chest:     Chest wall: No tenderness.  Abdominal:     General: Bowel sounds are normal. There is no distension.     Palpations: Abdomen is soft. There is no mass.     Tenderness: There is no abdominal tenderness. There is no guarding or rebound.  Musculoskeletal:        General: No tenderness. Normal range of motion.     Cervical back: Normal range of motion and neck supple.  Lymphadenopathy:     Cervical: No cervical adenopathy.  Skin:    General: Skin is warm and dry.     Findings: No erythema or rash.  Neurological:     General: No focal deficit present.     Mental Status: She is alert and oriented to person, place, and time.     Cranial Nerves: No cranial nerve deficit.     Motor: No abnormal muscle tone.     Coordination: Coordination normal.     Deep Tendon Reflexes: Reflexes are normal and symmetric. Reflexes normal.  Psychiatric:        Mood and Affect: Mood normal.        Behavior: Behavior normal.        Thought Content: Thought content normal.        Judgment:  Judgment normal.           Assessment & Plan:  Well exam. We discussed diet and exercise. Get fasting labs. I asked her to send us  a copy of the lab work she had done earlier this year. We will treat her onychomycosis again with Terbinafine . Refer to Dermatology for the skin lesions.  Garnette Olmsted, MD

## 2024-02-23 ENCOUNTER — Other Ambulatory Visit (HOSPITAL_COMMUNITY): Payer: Self-pay

## 2024-02-23 DIAGNOSIS — M5417 Radiculopathy, lumbosacral region: Secondary | ICD-10-CM | POA: Diagnosis not present

## 2024-02-23 DIAGNOSIS — Z79891 Long term (current) use of opiate analgesic: Secondary | ICD-10-CM | POA: Diagnosis not present

## 2024-02-23 DIAGNOSIS — M5136 Other intervertebral disc degeneration, lumbar region with discogenic back pain only: Secondary | ICD-10-CM | POA: Diagnosis not present

## 2024-02-23 DIAGNOSIS — G894 Chronic pain syndrome: Secondary | ICD-10-CM | POA: Diagnosis not present

## 2024-02-23 MED ORDER — MORPHINE SULFATE ER 30 MG PO TBCR
30.0000 mg | EXTENDED_RELEASE_TABLET | Freq: Two times a day (BID) | ORAL | 0 refills | Status: AC
  Filled 2024-05-11: qty 60, 30d supply, fill #0

## 2024-02-23 MED ORDER — MORPHINE SULFATE ER 30 MG PO TBCR
30.0000 mg | EXTENDED_RELEASE_TABLET | Freq: Two times a day (BID) | ORAL | 0 refills | Status: AC
  Filled 2024-03-11: qty 60, 30d supply, fill #0

## 2024-02-23 MED ORDER — MORPHINE SULFATE ER 30 MG PO TBCR
30.0000 mg | EXTENDED_RELEASE_TABLET | Freq: Two times a day (BID) | ORAL | 0 refills | Status: AC
  Filled 2024-04-11: qty 60, 30d supply, fill #0

## 2024-03-11 ENCOUNTER — Other Ambulatory Visit (HOSPITAL_COMMUNITY): Payer: Self-pay

## 2024-04-11 ENCOUNTER — Other Ambulatory Visit (HOSPITAL_COMMUNITY): Payer: Self-pay

## 2024-04-13 ENCOUNTER — Other Ambulatory Visit: Payer: Self-pay | Admitting: Family Medicine

## 2024-04-26 ENCOUNTER — Other Ambulatory Visit: Payer: Self-pay | Admitting: Family Medicine

## 2024-04-26 DIAGNOSIS — M6281 Muscle weakness (generalized): Secondary | ICD-10-CM | POA: Diagnosis not present

## 2024-04-26 DIAGNOSIS — Z13220 Encounter for screening for lipoid disorders: Secondary | ICD-10-CM | POA: Diagnosis not present

## 2024-04-26 DIAGNOSIS — R5383 Other fatigue: Secondary | ICD-10-CM | POA: Diagnosis not present

## 2024-04-26 DIAGNOSIS — M797 Fibromyalgia: Secondary | ICD-10-CM | POA: Diagnosis not present

## 2024-04-26 DIAGNOSIS — G8929 Other chronic pain: Secondary | ICD-10-CM | POA: Diagnosis not present

## 2024-05-11 ENCOUNTER — Other Ambulatory Visit (HOSPITAL_COMMUNITY): Payer: Self-pay

## 2024-05-13 DIAGNOSIS — N951 Menopausal and female climacteric states: Secondary | ICD-10-CM | POA: Diagnosis not present

## 2024-05-13 DIAGNOSIS — E039 Hypothyroidism, unspecified: Secondary | ICD-10-CM | POA: Diagnosis not present

## 2024-05-13 DIAGNOSIS — M797 Fibromyalgia: Secondary | ICD-10-CM | POA: Diagnosis not present

## 2024-05-13 DIAGNOSIS — R5383 Other fatigue: Secondary | ICD-10-CM | POA: Diagnosis not present

## 2024-05-16 ENCOUNTER — Encounter: Payer: Self-pay | Admitting: Physician Assistant

## 2024-05-16 ENCOUNTER — Ambulatory Visit: Admitting: Physician Assistant

## 2024-05-16 VITALS — BP 111/77 | HR 59

## 2024-05-16 DIAGNOSIS — L814 Other melanin hyperpigmentation: Secondary | ICD-10-CM

## 2024-05-16 DIAGNOSIS — D229 Melanocytic nevi, unspecified: Secondary | ICD-10-CM

## 2024-05-16 DIAGNOSIS — D1801 Hemangioma of skin and subcutaneous tissue: Secondary | ICD-10-CM

## 2024-05-16 NOTE — Progress Notes (Signed)
   New Patient Visit   Subjective  Margaret [name] is a 53 y.o. female NEW PATIENT who presents for the following: spot check  Patient states she has spit check located at the right arm and left breast  that she would like to have examined. Patient reports the areas have been there for 3 years. She reports the areas are not bothersome She states that the areas have not spread. Patient reports she has not previously been treated for these areas. Patient denies Hx of bx. Patient admits family history of skin cancer(s), patient reports that her father did but unsure of what kind.    The following portions of the chart were reviewed this encounter and updated as appropriate: medications, allergies, medical history  Review of Systems:  No other skin or systemic complaints except as noted in HPI or Assessment and Plan.  Objective  Well appearing patient in no apparent distress; mood and affect are within normal limits.   A focused examination was performed of the following areas: Arms and abdomen  Relevant exam findings are noted in the Assessment and Plan.    Assessment & Plan   LENTIGINES Exam: scattered tan macules Due to sun exposure Treatment Plan: Benign-appearing, observe. Recommend daily broad spectrum sunscreen SPF 30+ to sun-exposed areas, reapply every 2 hours as needed.  Call for any changes  HEMANGIOMA - LEFT BREAST  Exam: pink-purple macule  Reassurance provided   BENIGN APPEARING NEVUS - LEFT ABDOMEN  - flesh colored papule Reassurance provided     LENTIGINES   HEMANGIOMA OF SKIN   BENIGN NEVUS    Return in about 1 year (around 05/16/2025) for TBSE follow up.  I, Doyce Pan, CMA, am acting as scribe for Taliana Mersereau K, PA-C.   Documentation: I have reviewed the above documentation for accuracy and completeness, and I agree with the above.  Kendrick Haapala K, PA-C

## 2024-05-16 NOTE — Patient Instructions (Signed)

## 2024-05-26 ENCOUNTER — Other Ambulatory Visit (HOSPITAL_BASED_OUTPATIENT_CLINIC_OR_DEPARTMENT_OTHER): Payer: Self-pay

## 2024-05-26 DIAGNOSIS — M5030 Other cervical disc degeneration, unspecified cervical region: Secondary | ICD-10-CM | POA: Diagnosis not present

## 2024-05-26 DIAGNOSIS — M5417 Radiculopathy, lumbosacral region: Secondary | ICD-10-CM | POA: Diagnosis not present

## 2024-05-26 DIAGNOSIS — G894 Chronic pain syndrome: Secondary | ICD-10-CM | POA: Diagnosis not present

## 2024-05-26 DIAGNOSIS — Z79891 Long term (current) use of opiate analgesic: Secondary | ICD-10-CM | POA: Diagnosis not present

## 2024-05-26 MED ORDER — MORPHINE SULFATE ER 30 MG PO TBCR
30.0000 mg | EXTENDED_RELEASE_TABLET | Freq: Two times a day (BID) | ORAL | 0 refills | Status: AC | PRN
  Filled 2024-07-11: qty 60, 30d supply, fill #0

## 2024-05-26 MED ORDER — MORPHINE SULFATE ER 30 MG PO TBCR: 30.0000 mg | EXTENDED_RELEASE_TABLET | Freq: Two times a day (BID) | ORAL | 0 refills | Status: AC

## 2024-05-26 MED ORDER — MORPHINE SULFATE ER 30 MG PO TBCR
30.0000 mg | EXTENDED_RELEASE_TABLET | Freq: Two times a day (BID) | ORAL | 0 refills | Status: AC | PRN
  Filled 2024-06-06 (×3): qty 60, 30d supply, fill #0

## 2024-06-03 ENCOUNTER — Other Ambulatory Visit (HOSPITAL_BASED_OUTPATIENT_CLINIC_OR_DEPARTMENT_OTHER): Payer: Self-pay

## 2024-06-06 ENCOUNTER — Other Ambulatory Visit (HOSPITAL_COMMUNITY): Payer: Self-pay

## 2024-06-06 ENCOUNTER — Other Ambulatory Visit (HOSPITAL_BASED_OUTPATIENT_CLINIC_OR_DEPARTMENT_OTHER): Payer: Self-pay

## 2024-07-11 ENCOUNTER — Other Ambulatory Visit: Payer: Self-pay

## 2024-07-11 ENCOUNTER — Other Ambulatory Visit (HOSPITAL_BASED_OUTPATIENT_CLINIC_OR_DEPARTMENT_OTHER): Payer: Self-pay

## 2025-07-26 ENCOUNTER — Ambulatory Visit: Admitting: Physician Assistant
